# Patient Record
Sex: Female | Born: 1990 | Race: Black or African American | Hispanic: No | Marital: Single | Smoking: Current every day smoker
Health system: Southern US, Community
[De-identification: ages and names within clinical notes are randomized; demographics above are authoritative.]

## PROBLEM LIST (undated history)

## (undated) ENCOUNTER — Inpatient Hospital Stay: Payer: Self-pay

## (undated) DIAGNOSIS — D649 Anemia, unspecified: Secondary | ICD-10-CM

## (undated) DIAGNOSIS — F99 Mental disorder, not otherwise specified: Secondary | ICD-10-CM

## (undated) DIAGNOSIS — S0990XA Unspecified injury of head, initial encounter: Secondary | ICD-10-CM

## (undated) DIAGNOSIS — A6000 Herpesviral infection of urogenital system, unspecified: Secondary | ICD-10-CM

## (undated) DIAGNOSIS — J45909 Unspecified asthma, uncomplicated: Secondary | ICD-10-CM

## (undated) DIAGNOSIS — B009 Herpesviral infection, unspecified: Secondary | ICD-10-CM

## (undated) HISTORY — DX: Herpesviral infection of urogenital system, unspecified: A60.00

## (undated) HISTORY — DX: Unspecified injury of head, initial encounter: S09.90XA

## (undated) HISTORY — PX: NO PAST SURGERIES: SHX2092

## (undated) HISTORY — DX: Herpesviral infection, unspecified: B00.9

## (undated) HISTORY — DX: Mental disorder, not otherwise specified: F99

---

## 2015-01-09 ENCOUNTER — Other Ambulatory Visit: Payer: Self-pay | Admitting: Advanced Practice Midwife

## 2015-01-09 DIAGNOSIS — O0992 Supervision of high risk pregnancy, unspecified, second trimester: Secondary | ICD-10-CM

## 2015-01-10 LAB — OB RESULTS CONSOLE GC/CHLAMYDIA
CHLAMYDIA, DNA PROBE: NEGATIVE
GC PROBE AMP, GENITAL: NEGATIVE

## 2015-01-10 LAB — OB RESULTS CONSOLE VARICELLA ZOSTER ANTIBODY, IGG: VARICELLA IGG: NON-IMMUNE/NOT IMMUNE

## 2015-01-10 LAB — OB RESULTS CONSOLE RUBELLA ANTIBODY, IGM: Rubella: IMMUNE

## 2015-01-10 LAB — OB RESULTS CONSOLE ABO/RH: RH Type: POSITIVE

## 2015-01-10 LAB — OB RESULTS CONSOLE HEPATITIS B SURFACE ANTIGEN: HEP B S AG: NEGATIVE

## 2015-01-10 LAB — OB RESULTS CONSOLE HIV ANTIBODY (ROUTINE TESTING): HIV: NONREACTIVE

## 2015-01-10 LAB — OB RESULTS CONSOLE RPR: RPR: NONREACTIVE

## 2015-01-10 LAB — OB RESULTS CONSOLE ANTIBODY SCREEN: ANTIBODY SCREEN: NEGATIVE

## 2015-01-12 ENCOUNTER — Ambulatory Visit: Payer: Medicaid Other

## 2015-01-17 ENCOUNTER — Other Ambulatory Visit: Payer: Self-pay | Admitting: Advanced Practice Midwife

## 2015-01-17 ENCOUNTER — Ambulatory Visit
Admission: RE | Admit: 2015-01-17 | Discharge: 2015-01-17 | Disposition: A | Discharge status: Home / Self Care | Payer: Medicaid Other | Source: Ambulatory Visit | Attending: Advanced Practice Midwife | Admitting: Advanced Practice Midwife

## 2015-01-17 DIAGNOSIS — Z3A33 33 weeks gestation of pregnancy: Secondary | ICD-10-CM | POA: Diagnosis not present

## 2015-01-17 DIAGNOSIS — Z3689 Encounter for other specified antenatal screening: Secondary | ICD-10-CM

## 2015-01-17 DIAGNOSIS — Z36 Encounter for antenatal screening of mother: Secondary | ICD-10-CM | POA: Insufficient documentation

## 2015-01-21 NOTE — L&D Delivery Note (Signed)
Delivery Note At 12:42 PM a viable and healthy female was delivered via Vaginal, Spontaneous Delivery (Presentation:LOA).  APGAR: 7,9.  weight 6 lb 7.7 oz (2940 g).   Placenta status: Spontaneous.  Cord: 3 vessel cord  with the following complications: nuchal x1, tight, ligated at the perineum Cord pH: pending  Anesthesia: Epidural  Episiotomy:  none Lacerations:  Hemostatic right labial, no repair Est. Blood Loss (mL):  250  Mom to postpartum.  Baby to Couplet care / Skin to Skin.  IOL for postdates, cervical ripening overnight with pitocin. SROM for clear fluid. Period of Cat II strip with concerning decels and min variability, improved with conservative measures. Delivery quickly progressed and patient did well.  Christeen Douglas 02/22/2015, 1:01 PM

## 2015-02-08 ENCOUNTER — Observation Stay: Payer: Medicaid Other

## 2015-02-08 ENCOUNTER — Encounter: Payer: Self-pay | Admitting: *Deleted

## 2015-02-08 ENCOUNTER — Observation Stay
Admission: EM | Admit: 2015-02-08 | Discharge: 2015-02-08 | Disposition: A | Discharge status: Home / Self Care | Payer: Medicaid Other | Attending: Obstetrics and Gynecology | Admitting: Obstetrics and Gynecology

## 2015-02-08 DIAGNOSIS — Z3A35 35 weeks gestation of pregnancy: Secondary | ICD-10-CM | POA: Insufficient documentation

## 2015-02-08 DIAGNOSIS — O99333 Smoking (tobacco) complicating pregnancy, third trimester: Secondary | ICD-10-CM | POA: Insufficient documentation

## 2015-02-08 DIAGNOSIS — O26849 Uterine size-date discrepancy, unspecified trimester: Secondary | ICD-10-CM | POA: Diagnosis present

## 2015-02-08 DIAGNOSIS — F17210 Nicotine dependence, cigarettes, uncomplicated: Secondary | ICD-10-CM | POA: Diagnosis not present

## 2015-02-08 HISTORY — DX: Unspecified asthma, uncomplicated: J45.909

## 2015-02-08 LAB — OB RESULTS CONSOLE GBS: STREP GROUP B AG: POSITIVE

## 2015-02-08 NOTE — OB Triage Provider Note (Signed)
25 yo [id number] with LMP of 05/11/15 with 1 prior [facility] visit at [facility] prior to today. [place] EDD 03/01/2015 done in late preganc at 35 6/7 weeks. . Pt has uterine size discrepancy today at [facility] measuring 33 cms at 39 weeks. Pt is here for fetal tachycardia today and was advised to come to [facility] for evaluation and tx.  PMH: Asthma, varicella non-immune, hx of asthma Rx with Albuterol Inhaler, hearing loss referred toi ENT PSH: None FMH: unknown Social: Late PNC, UDS-+ MJ 01/09/15, smoker tobacco [place]eriges: Seasonal Allergies Shellfish (Critical) Pt declined flu shot this year Gen: 25 yo female in NAD Heart: reg, no M/R/G. Lungs: CTA bilat,no W/R/R. Abd: Gravid, fundal ht 33 cms today at [facility] Extrems: no edema, no varicosities [place]: AFI 10.8 cms EFW 10% 2332 g FHT: Cat I, + Accels, no decels VE not done A: IUP at 39 weeks with late dating 2. AFI 10.8 cms 3. EFW 10% P: Patient advised to do strict FKC's 2. NST's 2 x a week 3. Schedule IOL by 03/01/15.  4. AFI weekly

## 2015-02-08 NOTE — Discharge Summary (Signed)
Discharge instructions reviewed with patient including follow up appointments, fetal kick counts, signs of preterm labor, and when to seek medical attention. All questions answered. Patient discharged home in stable condition, ambulatory with steady gait. Patient accompanied by family members.

## 2015-02-08 NOTE — Progress Notes (Signed)
Report received from Georgiana Shore, assumed care.  Pt in wheelchair to Ultrasound.

## 2015-02-11 ENCOUNTER — Inpatient Hospital Stay
Admission: RE | Admit: 2015-02-11 | Discharge: 2015-02-11 | Disposition: A | Discharge status: Home / Self Care | Payer: Medicaid Other | Attending: Obstetrics and Gynecology | Admitting: Obstetrics and Gynecology

## 2015-02-11 ENCOUNTER — Encounter: Payer: Self-pay | Admitting: *Deleted

## 2015-02-11 HISTORY — DX: Anemia, unspecified: D64.9

## 2015-02-11 NOTE — OB Triage Note (Addendum)
Triage visit for NST   Linda Hester is a 25 y.o. [id number]. She is at [redacted]w[redacted]d gestation by [place] on 01/17/15 with an EDD of 03/01/15, which was a 2 week discrepancy from her LMP of 05/11/14 . She presents for a scheduled NST.  Indication: Small for gestational age EFW @ 21%, Late entry to prenatal care at 33 weeks, AFI 10.9 on 02/09/15, +MJ use   S: Resting comfortably. no CTX, no LOF, no VB. Active fetal movement. No concerns today O:  BP 108/73 mmHg  Pulse 98  Temp(Src) 98.3 F (36.8 C) (Oral)  Resp 16  Ht  (1.753 m)  Wt 85.73 kg (189 lb)  BMI 27.90 kg/m2 No results found for this or any previous visit (from the past 48 hour(s)).   Gen: NAD, AAOx3      Abd: FNTTP/ FH: 34-35cm      Ext: Non-tender, Nonedmeatous    FHT: 150 bmp/Moderate variability/+accels/no decels  TOCO: x1 UC SVE:  deferred was check at [facility] last week - closed  Ultrasound on 02/09/15  IMPRESSION: 1. Single living intrauterine gestation at 35 weeks 5 days by today's scan, compared to the expected gestational age of [redacted] weeks 0 days by first sonogram dating. Less than expected interval fetal growth. EFW is now at 2712g, which 21% using the first sonogram dating. Cannot exclude a developing small for gestational age fetus. 2. Normal amniotic fluid volume. AFI 10.9. 3. No fetal or maternal abnormalities demonstrated.  A/P:  25 y.o. [id number] [redacted]w[redacted]d with likely SGA, normal AFI at 10.9 on 02/09/15, late entry to prenatal care, +MJ use    Reactive NST, with moderate variability and accelerations, no decels  Fetal Wellbeing: Reassuring  D/c home stable, precautions reviewed, follow-up Tuesday at [facility] for ob appt and Wednesday at [facility] L&D for AFI and NST   Strict FKC's   Labor precautions and warning s/s reviewed   Carlean Jews, CNM

## 2015-02-11 NOTE — OB Triage Note (Signed)
Scheduled NST due to low AFI, per patient. Linda Hester

## 2015-02-14 ENCOUNTER — Inpatient Hospital Stay
Admission: RE | Admit: 2015-02-14 | Discharge: 2015-02-14 | Disposition: A | Discharge status: Home / Self Care | Payer: Medicaid Other | Attending: Obstetrics and Gynecology | Admitting: Obstetrics and Gynecology

## 2015-02-14 ENCOUNTER — Inpatient Hospital Stay: Payer: Medicaid Other

## 2015-02-14 DIAGNOSIS — Z3A38 38 weeks gestation of pregnancy: Secondary | ICD-10-CM | POA: Diagnosis not present

## 2015-02-14 DIAGNOSIS — O4100X0 Oligohydramnios, unspecified trimester, not applicable or unspecified: Secondary | ICD-10-CM

## 2015-02-14 DIAGNOSIS — Z3493 Encounter for supervision of normal pregnancy, unspecified, third trimester: Secondary | ICD-10-CM | POA: Insufficient documentation

## 2015-02-14 NOTE — OB Triage Note (Signed)
Patient came in for scheduled NST. No complaints of contractions or pain at this time.

## 2015-02-14 NOTE — Discharge Summary (Signed)
Discharge instructions reviewed with patient including follow up appointments, fetal kick counts, signs of preterm labor and when to seek medical attention. All questions answered. Patient discharged home in stable condition, ambulatory with steady gait, escorted by family members.

## 2015-02-14 NOTE — OB Triage Provider Note (Signed)
Triage visit for NST   Linda Hester is a 25 y.o. [id number]. She is at [redacted]w[redacted]d gestation. She presents for a scheduled NST.  Indication: Recent AFI in 10%  S: Resting comfortably. no CTX, no VB. Active fetal movement.  O:  BP 109/65 mmHg  Temp(Src) 98.3 F (36.8 C) (Oral)  Resp 18  Wt 86.183 kg (190 lb) No results found for this or any previous visit (from the past 48 hour(s)).   Gen: NAD, AAOx3      Abd: FNTTP      Ext: Non-tender, Nonedmeatous    FHT: 145, mod var, +accels, no decels TOCO: quiet SVE:  deferred  Number of Fetuses: 1  Heart Rate: 149 bpm  Movement: Yes  Presentation: Cephalic  Placental Location: Anterior  Previa: None  Amniotic Fluid (Subjective): Within normal limits.  AFI 16.2  BPD: 8.6cm 34w 5d  MATERNAL FINDINGS:  Cervix: Not evaluated.  Uterus/Adnexae: No abnormality visualized.  IMPRESSION: 1. Single viable intrauterine pregnancy. BPD corresponds to 34 weeks 5 days. Similar age noted on prior scan of 02/08/2015 . Slight discrepancy may be related to positioning. Assigned gestational age is 37 weeks 6 days. Clinical correlation suggested.  2. Amniotic fluid volume appears normal. AFI of 16.2.  A/P:  25 y.o. [id number] [redacted]w[redacted]d with oligohydramnios, now resolved..    Reactive NST, with moderate variability and accelerations, no decels  Fetal Wellbeing: Reassuring  D/c home stable, precautions reviewed, follow-up as scheduled.

## 2015-02-14 NOTE — OB Triage Note (Deleted)
Pt. Present with c/o vomiting since yesterday, on & off.  Diarrhea started this morning. Pt. Feels weak from emesis. "Everything I eat, I throw up."

## 2015-02-18 ENCOUNTER — Observation Stay
Admission: RE | Admit: 2015-02-18 | Discharge: 2015-02-18 | Disposition: A | Discharge status: Home / Self Care | Payer: Medicaid Other | Attending: Obstetrics and Gynecology | Admitting: Obstetrics and Gynecology

## 2015-02-18 DIAGNOSIS — Z3689 Encounter for other specified antenatal screening: Secondary | ICD-10-CM

## 2015-02-18 DIAGNOSIS — O4103X0 Oligohydramnios, third trimester, not applicable or unspecified: Principal | ICD-10-CM | POA: Insufficient documentation

## 2015-02-18 DIAGNOSIS — Z3A38 38 weeks gestation of pregnancy: Secondary | ICD-10-CM | POA: Diagnosis not present

## 2015-02-18 NOTE — Progress Notes (Signed)
Patient ID: Fraser Din, female   DOB: [birth year], 25 y.o.   MRN: [id number] Triage visit for NST   Ryley Alcaide is a 25 y.o. [id number]. She is at [redacted]w[redacted]d gestation. She presents for a scheduled NST.  Indication: Oligohydramnios  S: Resting comfortably. no CTX, no VB. Active fetal movement. O:  BP 123/65 mmHg  Pulse 84  Temp(Src) 98.5 F (36.9 C) (Oral)  Resp 18  Ht  (1.753 m)  Wt 86.183 kg (190 lb)  BMI 28.05 kg/m2 No results found for this or any previous visit (from the past 48 hour(s)).   Gen: NAD, AAOx3      Abd: FNTTP      Ext: Non-tender, Nonedmeatous    FHT: Cat I strip with baseline 150, mod var, +accels, no decels TOCO: quiet SVE:  not assessed   A/P:  25 y.o. [id number] [redacted]w[redacted]d with oligo.    Reactive NST, with moderate variability and accelerations, no decels  Fetal Wellbeing: Reassuring  D/c home stable, precautions reviewed, follow-up as scheduled tomorrow.

## 2015-02-21 ENCOUNTER — Encounter: Payer: Self-pay | Admitting: *Deleted

## 2015-02-21 ENCOUNTER — Inpatient Hospital Stay
Admission: RE | Admit: 2015-02-21 | Discharge: 2015-02-24 | DRG: 775 | Disposition: A | Discharge status: Home / Self Care | Payer: Medicaid Other | Source: Ambulatory Visit | Attending: Obstetrics and Gynecology | Admitting: Obstetrics and Gynecology

## 2015-02-21 DIAGNOSIS — Z3A40 40 weeks gestation of pregnancy: Secondary | ICD-10-CM

## 2015-02-21 DIAGNOSIS — J45909 Unspecified asthma, uncomplicated: Secondary | ICD-10-CM | POA: Diagnosis present

## 2015-02-21 DIAGNOSIS — O365930 Maternal care for other known or suspected poor fetal growth, third trimester, not applicable or unspecified: Principal | ICD-10-CM | POA: Diagnosis present

## 2015-02-21 DIAGNOSIS — O9952 Diseases of the respiratory system complicating childbirth: Secondary | ICD-10-CM | POA: Diagnosis present

## 2015-02-21 DIAGNOSIS — O480 Post-term pregnancy: Secondary | ICD-10-CM | POA: Diagnosis present

## 2015-02-21 DIAGNOSIS — O99824 Streptococcus B carrier state complicating childbirth: Secondary | ICD-10-CM | POA: Diagnosis present

## 2015-02-21 DIAGNOSIS — IMO0002 Reserved for concepts with insufficient information to code with codable children: Secondary | ICD-10-CM

## 2015-02-21 DIAGNOSIS — Z87891 Personal history of nicotine dependence: Secondary | ICD-10-CM | POA: Diagnosis not present

## 2015-02-21 DIAGNOSIS — O4103X0 Oligohydramnios, third trimester, not applicable or unspecified: Secondary | ICD-10-CM | POA: Diagnosis present

## 2015-02-21 DIAGNOSIS — Z23 Encounter for immunization: Secondary | ICD-10-CM | POA: Diagnosis not present

## 2015-02-21 HISTORY — DX: Reserved for concepts with insufficient information to code with codable children: IMO0002

## 2015-02-21 LAB — URINE DRUG SCREEN, QUALITATIVE (ARMC ONLY)
Amphetamines, Ur Screen: NOT DETECTED
BARBITURATES, UR SCREEN: NOT DETECTED
BENZODIAZEPINE, UR SCRN: NOT DETECTED
CANNABINOID 50 NG, UR ~~LOC~~: NOT DETECTED
COCAINE METABOLITE, UR ~~LOC~~: NOT DETECTED
MDMA (Ecstasy)Ur Screen: NOT DETECTED
METHADONE SCREEN, URINE: NOT DETECTED
Opiate, Ur Screen: NOT DETECTED
Phencyclidine (PCP) Ur S: NOT DETECTED
TRICYCLIC, UR SCREEN: NOT DETECTED

## 2015-02-21 LAB — ABO/RH: ABO/RH(D): AB POS

## 2015-02-21 LAB — RAPID HIV SCREEN (HIV 1/2 AB+AG)
HIV 1/2 Antibodies: NONREACTIVE
HIV-1 P24 ANTIGEN - HIV24: NONREACTIVE

## 2015-02-21 LAB — TYPE AND SCREEN
ABO/RH(D): AB POS
ANTIBODY SCREEN: NEGATIVE

## 2015-02-21 LAB — CBC
HEMATOCRIT: 30.7 % — AB (ref 35.0–47.0)
HEMOGLOBIN: 10.1 g/dL — AB (ref 12.0–16.0)
MCH: 27.5 pg (ref 26.0–34.0)
MCHC: 33 g/dL (ref 32.0–36.0)
MCV: 83.2 fL (ref 80.0–100.0)
Platelets: 195 10*3/uL (ref 150–440)
RBC: 3.69 MIL/uL — AB (ref 3.80–5.20)
RDW: 16.5 % — ABNORMAL HIGH (ref 11.5–14.5)
WBC: 8.5 10*3/uL (ref 3.6–11.0)

## 2015-02-21 LAB — CHLAMYDIA/NGC RT PCR (ARMC ONLY)
Chlamydia Tr: NOT DETECTED
N GONORRHOEAE: NOT DETECTED

## 2015-02-21 MED ORDER — AMMONIA AROMATIC IN INHA
RESPIRATORY_TRACT | Status: AC
  Filled 2015-02-21: qty 10

## 2015-02-21 MED ORDER — ACETAMINOPHEN 325 MG PO TABS
650.0000 mg | ORAL_TABLET | ORAL | Status: DC | PRN
  Filled 2015-02-21: qty 2

## 2015-02-21 MED ORDER — LACTATED RINGERS IV SOLN
INTRAVENOUS | Status: DC
  Administered 2015-02-21 (×2): via INTRAVENOUS
  Administered 2015-02-22: 1000 mL via INTRAVENOUS

## 2015-02-21 MED ORDER — AMPICILLIN SODIUM 1 G IJ SOLR
1.0000 g | Freq: Four times a day (QID) | INTRAMUSCULAR | Status: DC
  Administered 2015-02-21 – 2015-02-22 (×3): 1 g via INTRAVENOUS
  Filled 2015-02-21 (×7): qty 1000

## 2015-02-21 MED ORDER — OXYTOCIN 40 UNITS IN LACTATED RINGERS INFUSION - SIMPLE MED
2.5000 [IU]/h | INTRAVENOUS | Status: DC
  Filled 2015-02-21: qty 1000

## 2015-02-21 MED ORDER — MISOPROSTOL 200 MCG PO TABS
ORAL_TABLET | ORAL | Status: AC
  Filled 2015-02-21: qty 4

## 2015-02-21 MED ORDER — DINOPROSTONE 10 MG VA INST
10.0000 mg | VAGINAL_INSERT | Freq: Once | VAGINAL | Status: AC
  Administered 2015-02-21: 10 mg via VAGINAL
  Filled 2015-02-21 (×2): qty 1

## 2015-02-21 MED ORDER — ONDANSETRON HCL 4 MG/2ML IJ SOLN: 4.0000 mg | Freq: Four times a day (QID) | INTRAMUSCULAR | Status: DC | PRN

## 2015-02-21 MED ORDER — AMPICILLIN SODIUM 1 G IJ SOLR
1.0000 g | INTRAMUSCULAR | Status: DC
  Filled 2015-02-21 (×6): qty 1000

## 2015-02-21 MED ORDER — OXYTOCIN 10 UNIT/ML IJ SOLN
INTRAMUSCULAR | Status: AC
  Filled 2015-02-21: qty 2

## 2015-02-21 MED ORDER — LIDOCAINE HCL (PF) 1 % IJ SOLN
INTRAMUSCULAR | Status: DC
Start: 2015-02-21 — End: 2015-02-22
  Filled 2015-02-21: qty 30

## 2015-02-21 MED ORDER — CITRIC ACID-SODIUM CITRATE 334-500 MG/5ML PO SOLN: 30.0000 mL | ORAL | Status: DC | PRN

## 2015-02-21 MED ORDER — SODIUM CHLORIDE 0.9 % IV SOLN
2.0000 g | Freq: Once | INTRAVENOUS | Status: AC
  Administered 2015-02-21: 2 g via INTRAVENOUS
  Filled 2015-02-21: qty 2000

## 2015-02-21 MED ORDER — LACTATED RINGERS IV SOLN: 500.0000 mL | INTRAVENOUS | Status: DC | PRN

## 2015-02-21 MED ORDER — SODIUM CHLORIDE 0.9 % IV SOLN: 1.0000 g | INTRAVENOUS | Status: DC

## 2015-02-21 MED ORDER — BUTORPHANOL TARTRATE 1 MG/ML IJ SOLN
1.0000 mg | INTRAMUSCULAR | Status: DC | PRN
  Administered 2015-02-21 – 2015-02-22 (×4): 1 mg via INTRAVENOUS
  Filled 2015-02-21 (×4): qty 1

## 2015-02-21 MED ORDER — OXYTOCIN BOLUS FROM INFUSION: 500.0000 mL | INTRAVENOUS | Status: DC

## 2015-02-21 MED ORDER — LIDOCAINE HCL (PF) 1 % IJ SOLN
30.0000 mL | INTRAMUSCULAR | Status: AC | PRN
  Administered 2015-02-22: 3 mL via SUBCUTANEOUS

## 2015-02-21 NOTE — Progress Notes (Signed)
MD NOTE:  PD IOL @ 40+6 by LMP c/w 33wk sono APC: [facility] - concern for SGA versus IUGR due to late [facility] - 01/17/15 - EFW 2332g 10% @ 35+6 - 02/08/15 EFW 2712g 21% @ 37+0  Day 1 IOL Cervadil placed 4:30pm GBS+, receiving ampicillin  S: comfortable O: BP 129/81 mmHg  Pulse 85  Temp(Src) 98.6 F (37 C) (Oral)  Resp 18  GEN: NAD  EFW: 150, mod var, +accels, no decel Toco: Q51min  Drug screen neg  CBC Latest Ref Rng 02/21/2015  WBC 3.6 - 11.0 K/uL 8.5  Hemoglobin 12.0 - 16.0 g/dL 10.1(L)  Hematocrit 35.0 - 47.0 % 30.7(L)  Platelets 150 - 440 K/uL 195     A/P: 24yo [id number] @ 40+6 here for PD IOL. Cervadil in place 1. OB - start pitocin if SVE 2cm or greater after cervadil removed - if <2cm, place another cervadil - cat 1 tracing, periods of fetal tachycardia, but overall baseline wnl - cont GBS ppx with amp as initially ordered  Anticipate NSVD  Ala Dach, MD

## 2015-02-21 NOTE — Progress Notes (Signed)
Patient ID: Linda Hester, female   DOB: 05/13/1990, 25 y.o.   MRN: [id number] Linda Hester 07/28/1990 G2 P1 at 40+6 weeks based on Sure LMP of 05/11/14 . [facility] 02/15/15  Pt being followed for SGA and oligohydramnios . LAst AFI on 02/13/14 = 16 cm  Last EFW 2712 gm on 02/08/14 . noLOF , no  vaginal bleeding ,+ CTX  O;BP 120/71 mmHg  Pulse 95  Temp(Src) 98.3 F (36.8 C) (Oral)  Resp 16 ABDsoft  Gravid  CX closed  NSTreactive  Labs: none  A: 40+6 weeks , possible IUGR   P:admit and induce , see H+P

## 2015-02-21 NOTE — H&P (Addendum)
Linda Hester is a 25 y.o. female presenting for NST from [facility] . Currently 40+6 weeks based on sure LMP . EDC 02/15/15 . Being followed for IUGR with u/s on 01/17/15 EFW  2332g 10%. + CTX today  NO LOF , NO vag bleeding . + GBS . + MJ use 6 weeks ago  History OB History    Gravida Para Term Preterm AB TAB SAB Ectopic Multiple Living   Past Medical History  Diagnosis Date  . Asthma   . Anemia   Anemia HCT=9.8%  , ferritin =7   Past Surgical History  Procedure Laterality Date  . No past surgeries     Family History: family history is not on file. Social History:  reports that she has quit smoking. She does not have any smokeless tobacco history on file. She reports that she does not drink alcohol or use illicit drugs.   Prenatal Transfer Tool  Maternal Diabetes: No Genetic Screening:too late  Maternal Ultrasounds/Referrals: Abnormal:  Findings:   IUGR Fetal Ultrasounds or other Referrals:  None Maternal Substance Abuse:  Yes:  Type: Marijuana Significant Maternal Medications:  None Significant Maternal Lab Results:  Lab values include: Other: hemoglobin 9.8 on 01/09/15 Other Comments:  most recent AFI 16.2 cm = wnl   ROS  Dilation: Fingertip Effacement (%): 50 Station: -3 Exam by:: Schermerhorn Blood pressure 120/71, pulse 95, temperature 98.3 F (36.8 C), temperature source Oral, resp. rate 16. Exam Physical Exam  Lungs CTA CV RRR  Abd gravid non tender  Prenatal labs: ABO, Rh: AB/Positive/-- (12/21 0000) Antibody: Negative (12/21 0000) Rubella: Immune (12/21 0000) RPR:   non reactive  HBsAg: Negative (12/21 0000)  HIV: Non-reactive (12/21 0000)  GBS: Positive (01/19 0000)   Assessment/Plan: 40+6 week with possible IUGR / SGA   Cervidil induction  Drug screen  SCHERMERHORN,THOMAS 02/21/2015, 1:42 PM

## 2015-02-22 ENCOUNTER — Encounter: Payer: Self-pay | Admitting: *Deleted

## 2015-02-22 ENCOUNTER — Inpatient Hospital Stay: Payer: Medicaid Other | Admitting: Certified Registered Nurse Anesthetist

## 2015-02-22 LAB — RPR: RPR Ser Ql: NONREACTIVE

## 2015-02-22 MED ORDER — SODIUM CHLORIDE 0.9 % IV SOLN: 250.0000 mL | INTRAVENOUS | Status: DC | PRN

## 2015-02-22 MED ORDER — FLEET ENEMA 7-19 GM/118ML RE ENEM: 1.0000 | ENEMA | Freq: Every day | RECTAL | Status: DC | PRN

## 2015-02-22 MED ORDER — BENZOCAINE-MENTHOL 20-0.5 % EX AERO: 1.0000 "application " | INHALATION_SPRAY | CUTANEOUS | Status: DC | PRN

## 2015-02-22 MED ORDER — DIPHENHYDRAMINE HCL 25 MG PO CAPS
25.0000 mg | ORAL_CAPSULE | Freq: Four times a day (QID) | ORAL | Status: DC | PRN
Start: 2015-02-22 — End: 2015-02-24

## 2015-02-22 MED ORDER — FENTANYL 2.5 MCG/ML W/ROPIVACAINE 0.2% IN NS 100 ML EPIDURAL INFUSION (ARMC-ANES)
EPIDURAL | Status: AC
  Administered 2015-02-22: 10 mL/h via EPIDURAL
  Filled 2015-02-22: qty 100

## 2015-02-22 MED ORDER — OXYTOCIN 40 UNITS IN LACTATED RINGERS INFUSION - SIMPLE MED
1.0000 m[IU]/min | INTRAVENOUS | Status: DC
  Administered 2015-02-22: 1 m[IU]/min via INTRAVENOUS

## 2015-02-22 MED ORDER — BUPIVACAINE HCL (PF) 0.25 % IJ SOLN
INTRAMUSCULAR | Status: DC | PRN
  Administered 2015-02-22 (×2): 4 mL via EPIDURAL

## 2015-02-22 MED ORDER — BISACODYL 10 MG RE SUPP: 10.0000 mg | Freq: Every day | RECTAL | Status: DC | PRN

## 2015-02-22 MED ORDER — ONDANSETRON HCL 4 MG PO TABS: 4.0000 mg | ORAL_TABLET | ORAL | Status: DC | PRN

## 2015-02-22 MED ORDER — ACETAMINOPHEN 325 MG PO TABS
650.0000 mg | ORAL_TABLET | ORAL | Status: DC | PRN
Start: 2015-02-22 — End: 2015-02-24
  Administered 2015-02-22 – 2015-02-23 (×4): 650 mg via ORAL
  Filled 2015-02-22 (×3): qty 2

## 2015-02-22 MED ORDER — WITCH HAZEL-GLYCERIN EX PADS: 1.0000 "application " | MEDICATED_PAD | CUTANEOUS | Status: DC | PRN

## 2015-02-22 MED ORDER — MEASLES, MUMPS & RUBELLA VAC ~~LOC~~ INJ
0.5000 mL | INJECTION | Freq: Once | SUBCUTANEOUS | Status: DC
  Filled 2015-02-22: qty 0.5

## 2015-02-22 MED ORDER — LANOLIN HYDROUS EX OINT: TOPICAL_OINTMENT | CUTANEOUS | Status: DC | PRN

## 2015-02-22 MED ORDER — SODIUM CHLORIDE 0.9% FLUSH: 3.0000 mL | Freq: Two times a day (BID) | INTRAVENOUS | Status: DC

## 2015-02-22 MED ORDER — TERBUTALINE SULFATE 1 MG/ML IJ SOLN: 0.2500 mg | Freq: Once | INTRAMUSCULAR | Status: DC | PRN

## 2015-02-22 MED ORDER — SIMETHICONE 80 MG PO CHEW: 80.0000 mg | CHEWABLE_TABLET | ORAL | Status: DC | PRN

## 2015-02-22 MED ORDER — SODIUM CHLORIDE 0.9% FLUSH: 3.0000 mL | INTRAVENOUS | Status: DC | PRN

## 2015-02-22 MED ORDER — ONDANSETRON HCL 4 MG/2ML IJ SOLN: 4.0000 mg | INTRAMUSCULAR | Status: DC | PRN

## 2015-02-22 MED ORDER — SENNOSIDES-DOCUSATE SODIUM 8.6-50 MG PO TABS: 2.0000 | ORAL_TABLET | ORAL | Status: DC

## 2015-02-22 MED ORDER — DIBUCAINE 1 % RE OINT: 1.0000 "application " | TOPICAL_OINTMENT | RECTAL | Status: DC | PRN

## 2015-02-22 MED ORDER — PRENATAL MULTIVITAMIN CH
1.0000 | ORAL_TABLET | Freq: Every day | ORAL | Status: DC
  Administered 2015-02-23 – 2015-02-24 (×2): 1 via ORAL
  Filled 2015-02-22 (×2): qty 1

## 2015-02-22 MED ORDER — IBUPROFEN 600 MG PO TABS
600.0000 mg | ORAL_TABLET | Freq: Four times a day (QID) | ORAL | Status: DC
  Filled 2015-02-22: qty 1

## 2015-02-22 MED ORDER — LIDOCAINE-EPINEPHRINE (PF) 1.5 %-1:200000 IJ SOLN
INTRAMUSCULAR | Status: DC | PRN
  Administered 2015-02-22: 3 mL via EPIDURAL

## 2015-02-22 MED ORDER — ZOLPIDEM TARTRATE 5 MG PO TABS: 5.0000 mg | ORAL_TABLET | Freq: Every evening | ORAL | Status: DC | PRN

## 2015-02-22 MED ORDER — TETANUS-DIPHTH-ACELL PERTUSSIS 5-2.5-18.5 LF-MCG/0.5 IM SUSP
0.5000 mL | Freq: Once | INTRAMUSCULAR | Status: AC
  Administered 2015-02-24: 0.5 mL via INTRAMUSCULAR
  Filled 2015-02-22: qty 0.5

## 2015-02-22 NOTE — Progress Notes (Signed)
Note delayed from 11:56 for computer trouble.  S: Resting comfortably with epidural. + CTX, no LOF, VB O: Filed Vitals:   02/22/15 1115 02/22/15 1120 02/22/15 1135 02/22/15 1205  BP: 103/53 109/54 111/51 101/47  Pulse: 77 67 81 76  Temp:      TempSrc:      Resp:    20     Gen: NAD, AAOx3      Abd: FNTTP      Ext: Non-tender, Nonedmeatous    FHT: 130s with min var, + scalp stim acceleration, +variable, early and occ last decelerations TOCO: Q2-3 min SVE: 7/90/-1   A/P:  25 y.o. yo [id number] at [redacted]w[redacted]d for IOL/postdates.  Pt with Cat II strip. Reassuring accel with scalp stim. Rapid dilation with normal contraceptions. O2 applied, maternal positioning to try to address Cat II strip. I anticipate very soon NSVD.   Hildy Nicholl 12:22 PM

## 2015-02-22 NOTE — Anesthesia Preprocedure Evaluation (Signed)
Anesthesia Evaluation  Patient identified by MRN, date of birth, ID band Patient awake    Reviewed: Allergy & Precautions, H&P , Patient's Chart, lab work & pertinent test results  History of Anesthesia Complications Negative for: history of anesthetic complications  Airway Mallampati: II  TM Distance: >3 FB Neck ROM: full    Dental  (+) Teeth Intact   Pulmonary asthma , former smoker,    Pulmonary exam normal        Cardiovascular Normal cardiovascular exam     Neuro/Psych    GI/Hepatic negative GI ROS, Neg liver ROS,   Endo/Other  negative endocrine ROS  Renal/GU negative Renal ROS     Musculoskeletal   Abdominal   Peds  Hematology  (+) anemia ,   Anesthesia Other Findings   Reproductive/Obstetrics (+) Pregnancy                             Anesthesia Physical Anesthesia Plan  ASA: II  Anesthesia Plan: Epidural   Post-op Pain Management:    Induction:   Airway Management Planned:   Additional Equipment:   Intra-op Plan:   Post-operative Plan:   Informed Consent: I have reviewed the patients History and Physical, chart, labs and discussed the procedure including the risks, benefits and alternatives for the proposed anesthesia with the patient or authorized representative who has indicated his/her understanding and acceptance.     Plan Discussed with: Anesthesiologist  Anesthesia Plan Comments:         Anesthesia Quick Evaluation

## 2015-02-22 NOTE — Progress Notes (Signed)
Patient ID: Linda Hester, female   DOB: Feb 09, 1990, 25 y.o.   MRN: [id number] Assuming care of patient  G2 P1 at 41+0 weeks based on Sure LMP of 05/11/14 . EDC 02/15/15. Pt being followed for SGA and oligohydramnios . LAst AFI on 02/13/14 = 16 cm Last EFW 2712 gm on 02/08/14 . noLOF , no vaginal bleeding ,+ CTX.  Now s/p cervadil overnight, on pitocin of 3 with ct coupling q3 min.   Plan for epidural when desired, AROM when clinically appropriate.

## 2015-02-22 NOTE — Anesthesia Procedure Notes (Signed)
Epidural Patient location during procedure: OB Start time: 02/22/2015 10:52 AM End time: 02/22/2015 10:56 AM  Staffing Anesthesiologist: Lezlie Octave Resident/CRNA: Malva Cogan Performed by: resident/CRNA   Preanesthetic Checklist Completed: patient identified, site marked, surgical consent, pre-op evaluation, timeout performed, IV checked, risks and benefits discussed and monitors and equipment checked  Epidural Patient position: sitting Prep: Betadine Patient monitoring: continuous pulse ox and blood pressure Approach: midline Location: L3-L4 Injection technique: LOR saline  Needle:  Needle type: Tuohy  Needle gauge: 18 G Needle length: 9 cm Needle insertion depth: 6 cm Catheter type: closed end Catheter size: 20 Guage Catheter at skin depth: 11 cm Test dose: 1.5% lidocaine with Epi 1:200 K  Assessment Events: blood not aspirated, injection not painful and no injection resistance  Additional Notes Tolerated procedure without complicationsReason for block:procedure for pain

## 2015-02-23 LAB — CBC
HCT: 29.2 % — ABNORMAL LOW (ref 35.0–47.0)
Hemoglobin: 9.7 g/dL — ABNORMAL LOW (ref 12.0–16.0)
MCH: 27.5 pg (ref 26.0–34.0)
MCHC: 33.2 g/dL (ref 32.0–36.0)
MCV: 82.9 fL (ref 80.0–100.0)
Platelets: 156 10*3/uL (ref 150–440)
RBC: 3.53 MIL/uL — ABNORMAL LOW (ref 3.80–5.20)
RDW: 16.1 % — AB (ref 11.5–14.5)
WBC: 5.8 10*3/uL (ref 3.6–11.0)

## 2015-02-23 MED ORDER — MEDROXYPROGESTERONE ACETATE 150 MG/ML IM SUSP
150.0000 mg | Freq: Once | INTRAMUSCULAR | Status: AC
  Administered 2015-02-24: 150 mg via INTRAMUSCULAR
  Filled 2015-02-23: qty 1

## 2015-02-23 MED ORDER — IBUPROFEN 600 MG PO TABS
600.0000 mg | ORAL_TABLET | Freq: Four times a day (QID) | ORAL | Status: DC | PRN
  Administered 2015-02-23 – 2015-02-24 (×3): 600 mg via ORAL
  Filled 2015-02-23 (×3): qty 1

## 2015-02-23 NOTE — Plan of Care (Signed)
Problem: Nutritional: Goal: Mother's verbalization of comfort with breastfeeding process will improve Outcome: Not Applicable Date Met:  72/62/03 Patient is bottle feeding infant.

## 2015-02-23 NOTE — Anesthesia Postprocedure Evaluation (Signed)
Anesthesia Post Note  Patient: Linda Hester  Procedure(s) Performed: * No procedures listed *  Patient location during evaluation: Nursing Unit Anesthesia Type: Epidural Level of consciousness: awake and alert and oriented Pain management: satisfactory to patient Vital Signs Assessment: post-procedure vital signs reviewed and stable Respiratory status: respiratory function stable Cardiovascular status: stable Postop Assessment: no headache and no backache Anesthetic complications: no    Last Vitals:  Filed Vitals:   02/22/15 2330 02/23/15 0319  BP: 130/78 110/63  Pulse: 60 60  Temp: 36.9 C 36.7 C  Resp: 18     Last Pain:  Filed Vitals:   02/23/15 0405  PainSc: Asleep                 Clydene Pugh

## 2015-02-23 NOTE — Anesthesia Post-op Follow-up Note (Signed)
  Anesthesia Pain Follow-up Note  Patient: Linda Hester  Day #: 1  Date of Follow-up: 02/23/2015 Time: 7:22 AM  Last Vitals:  Filed Vitals:   02/22/15 2330 02/23/15 0319  BP: 130/78 110/63  Pulse: 60 60  Temp: 36.9 C 36.7 C  Resp: 18     Level of Consciousness: alert  Pain: mild   Side Effects:None  Catheter Site Exam:site not evaluated  Plan: D/C from anesthesia care  Clydene Pugh

## 2015-02-23 NOTE — Progress Notes (Signed)
Post Partum Day 1 Subjective: no complaints, up ad lib, voiding, tolerating PO and + flatus  Objective: Blood pressure 111/61, pulse 53, temperature 98.6 F (37 C), temperature source Oral, resp. rate 18, height  (1.753 m), weight 86.183 kg (190 lb), unknown if currently breastfeeding.  Physical Exam:  General: alert, cooperative, appears stated age and no distress Lochia: appropriate Uterine Fundus: firm Incision: none DVT Evaluation: No evidence of DVT seen on physical exam.   Recent Labs  02/21/15 1515 02/23/15 0635  HGB 10.1* 9.7*  HCT 30.7* 29.2*    Assessment/Plan: Plan for discharge tomorrow and Contraception depo  Bottle feeding   LOS: 2 days   Linda Hester 02/23/2015, 8:56 AM

## 2015-02-24 MED ORDER — VARICELLA VIRUS VACCINE LIVE 1350 PFU/0.5ML IJ SUSR
0.5000 mL | Freq: Once | INTRAMUSCULAR | Status: AC
  Administered 2015-02-24: 0.5 mL via SUBCUTANEOUS
  Filled 2015-02-24: qty 0.5

## 2015-02-24 NOTE — Progress Notes (Signed)
Patient discharged home with infant. Discharge instructions, prescriptions and follow up appointment given to and reviewed with patient and family. Patient verbalized understanding. Escorted out via wheelchair by Frann Rider, [employer] Nursing Student.

## 2015-02-24 NOTE — Discharge Summary (Signed)
Obstetric Discharge Summary Reason for Admission: induction of labor Prenatal Procedures: none Intrapartum Procedures: spontaneous vaginal delivery Postpartum Procedures: none Complications-Operative and Postpartum: none HEMOGLOBIN  Date Value Ref Range Status  02/23/2015 9.7* 12.0 - 16.0 g/dL Final   HCT  Date Value Ref Range Status  02/23/2015 29.2* 35.0 - 47.0 % Final    Physical Exam:  General: alert, cooperative, appears stated age and no distress Lochia: appropriate Uterine Fundus: firm Incision: none DVT Evaluation: No evidence of DVT seen on physical exam.  Discharge Diagnoses: Term Pregnancy-delivered  Discharge Information: Date: 02/24/2015 Activity: pelvic rest Diet: routine Medications: Ibuprofen Condition: stable Instructions: refer to practice specific booklet Discharge to: home F/u at [facility] Contraception with depo Bottle feeding  Newborn Data: Live born female  Birth Weight: 6 lb 7.7 oz (2940 g) APGAR: 7, 9  Home with mother.  Linda Hester 02/24/2015, 9:34 AM

## 2015-03-31 LAB — HM PAP SMEAR: HM PAP: NEGATIVE

## 2016-06-17 ENCOUNTER — Telehealth: Payer: Self-pay | Admitting: Emergency Medicine

## 2016-06-17 ENCOUNTER — Emergency Department
Admission: EM | Admit: 2016-06-17 | Discharge: 2016-06-17 | Disposition: A | Discharge status: Home / Self Care | Payer: Medicaid Other | Attending: Emergency Medicine | Admitting: Emergency Medicine

## 2016-06-17 ENCOUNTER — Encounter: Payer: Self-pay | Admitting: Emergency Medicine

## 2016-06-17 DIAGNOSIS — Z5321 Procedure and treatment not carried out due to patient leaving prior to being seen by health care provider: Secondary | ICD-10-CM | POA: Insufficient documentation

## 2016-06-17 DIAGNOSIS — Z87891 Personal history of nicotine dependence: Secondary | ICD-10-CM | POA: Insufficient documentation

## 2016-06-17 DIAGNOSIS — Z3A01 Less than 8 weeks gestation of pregnancy: Secondary | ICD-10-CM | POA: Insufficient documentation

## 2016-06-17 DIAGNOSIS — O209 Hemorrhage in early pregnancy, unspecified: Secondary | ICD-10-CM | POA: Insufficient documentation

## 2016-06-17 DIAGNOSIS — J45909 Unspecified asthma, uncomplicated: Secondary | ICD-10-CM | POA: Insufficient documentation

## 2016-06-17 LAB — COMPREHENSIVE METABOLIC PANEL
ALBUMIN: 4.5 g/dL (ref 3.5–5.0)
ALK PHOS: 69 U/L (ref 38–126)
ALT: 11 U/L — AB (ref 14–54)
ANION GAP: 6 (ref 5–15)
AST: 18 U/L (ref 15–41)
BUN: 8 mg/dL (ref 6–20)
CALCIUM: 9.4 mg/dL (ref 8.9–10.3)
CHLORIDE: 105 mmol/L (ref 101–111)
CO2: 25 mmol/L (ref 22–32)
Creatinine, Ser: 0.62 mg/dL (ref 0.44–1.00)
GFR calc Af Amer: >60 mL/min (ref >60)
GFR calc non Af Amer: >60 mL/min (ref >60)
GLUCOSE: 99 mg/dL (ref 65–99)
Potassium: 3.6 mmol/L (ref 3.5–5.1)
SODIUM: 136 mmol/L (ref 135–145)
Total Bilirubin: 0.9 mg/dL (ref 0.3–1.2)
Total Protein: 8.3 g/dL — ABNORMAL HIGH (ref 6.5–8.1)

## 2016-06-17 LAB — URINALYSIS, COMPLETE (UACMP) WITH MICROSCOPIC
Bacteria, UA: NONE SEEN
Bilirubin Urine: NEGATIVE
Glucose, UA: NEGATIVE mg/dL
Hgb urine dipstick: NEGATIVE
KETONES UR: NEGATIVE mg/dL
LEUKOCYTES UA: NEGATIVE
Nitrite: NEGATIVE
PH: 5 (ref 5.0–8.0)
Protein, ur: NEGATIVE mg/dL
SPECIFIC GRAVITY, URINE: 1.023 (ref 1.005–1.030)

## 2016-06-17 LAB — CBC
HCT: 38.2 % (ref 35.0–47.0)
HEMOGLOBIN: 12.9 g/dL (ref 12.0–16.0)
MCH: 28.5 pg (ref 26.0–34.0)
MCHC: 33.8 g/dL (ref 32.0–36.0)
MCV: 84.4 fL (ref 80.0–100.0)
Platelets: 224 10*3/uL (ref 150–440)
RBC: 4.53 MIL/uL (ref 3.80–5.20)
RDW: 14.2 % (ref 11.5–14.5)
WBC: 6.6 10*3/uL (ref 3.6–11.0)

## 2016-06-17 LAB — TYPE AND SCREEN
ABO/RH(D): AB POS
Antibody Screen: NEGATIVE

## 2016-06-17 LAB — HCG, QUANTITATIVE, PREGNANCY: HCG, BETA CHAIN, QUANT, S: 8240 m[IU]/mL — AB (ref <5)

## 2016-06-17 LAB — POCT PREGNANCY, URINE: Preg Test, Ur: POSITIVE — AB

## 2016-06-17 NOTE — Telephone Encounter (Signed)
Called patient due to ama to inquire about condition and follow up plans. Phone does not take incoming calls.

## 2016-06-17 NOTE — ED Triage Notes (Addendum)
While at work today began feeling weak and nauseous and started to have vaginal bleeding.  No c/o lower abdominal cramping.  Patient states she had a positive home pregnancy test last month.  Has not had prenatal care.  LMP:  03/2016.  G5 P2

## 2016-06-17 NOTE — ED Provider Notes (Signed)
Patient eloped prior to my evaluation.   Linda FilbertWilliams, Jonathan E, MD 06/17/16 [phone]

## 2016-06-17 NOTE — ED Notes (Signed)
Upon entering room to assess pt pt was not in room, EDP made aware

## 2016-07-23 IMAGING — US US OB COMP +14 WK
1 series · 13 of 28 positions shown · non-contrast
Comparison: none

CLINICAL DATA: Late to prenatal care. Gestational age by LMP of 35
weeks 6 days. Evaluate dating and anatomy.

EXAM:
OBSTETRICAL ULTRASOUND >14 WKS

[Series 1: us ob comp +14 wk · 0.26mm/px · 13 of 87 slices shown]
[im 4/87]
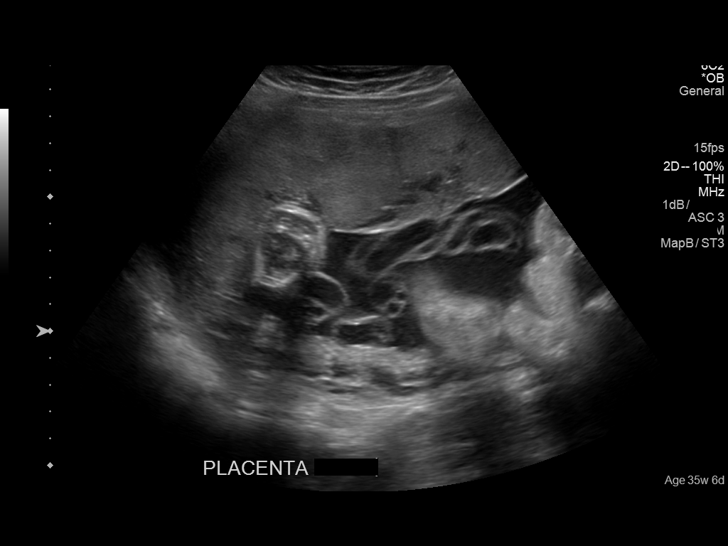
[im 10/87]
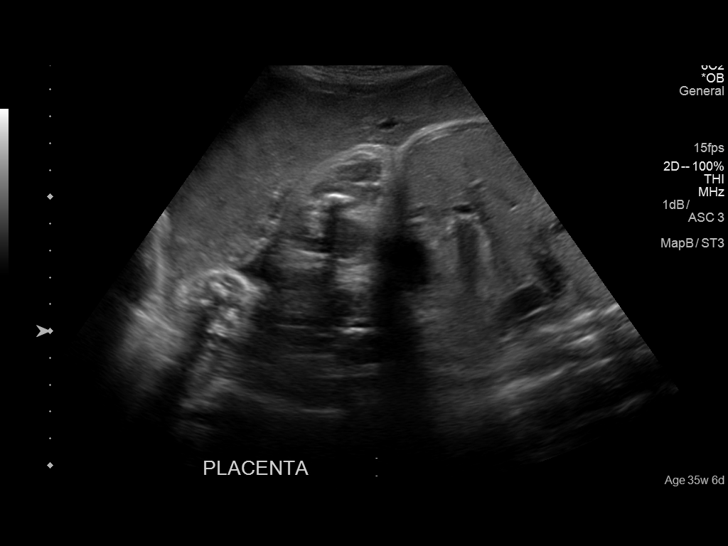
[im 16/87]
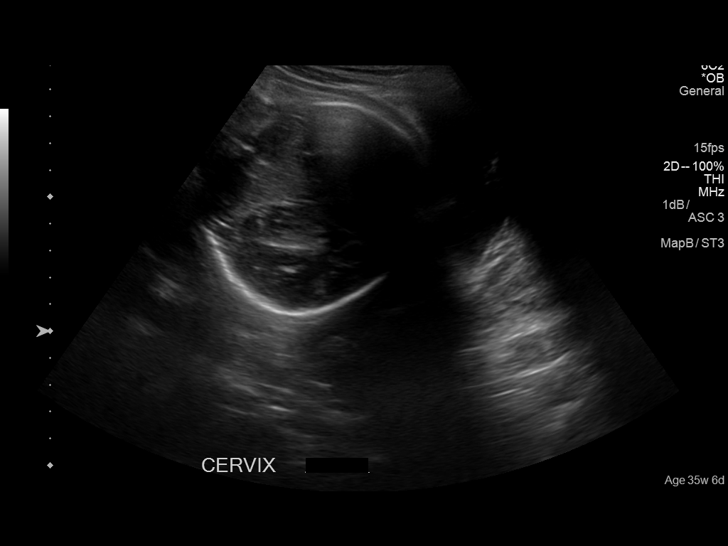
[im 23/87]
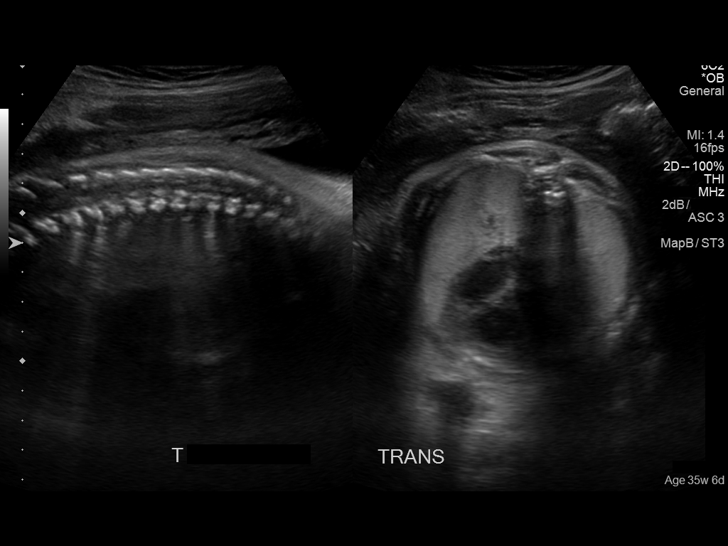
[im 29/87]
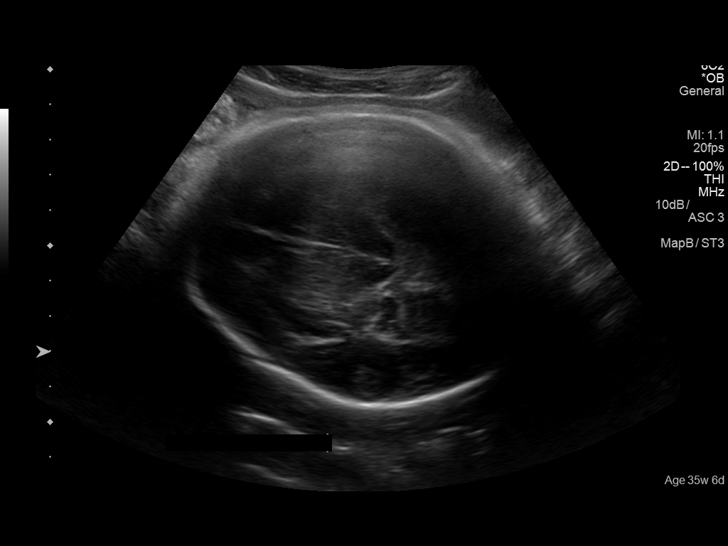
[im 36/87]
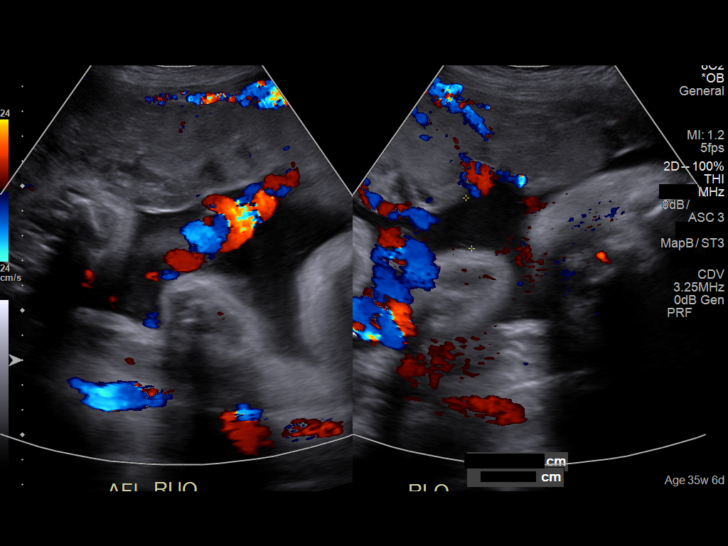
[im 45/87]
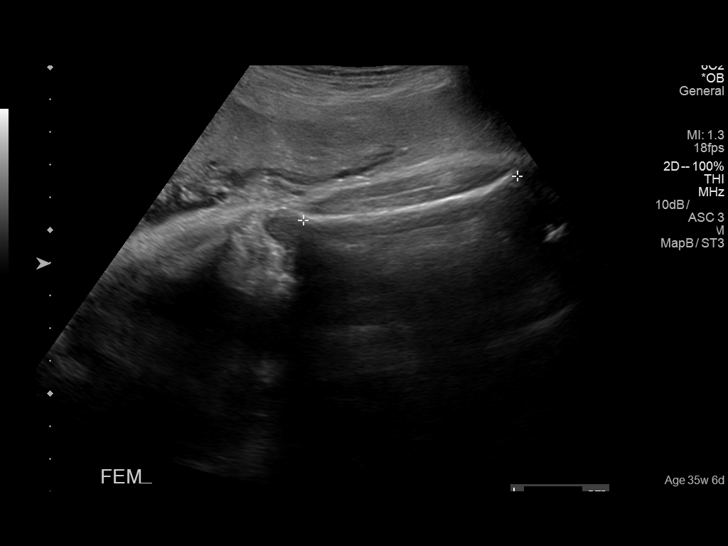
[im 51/87]
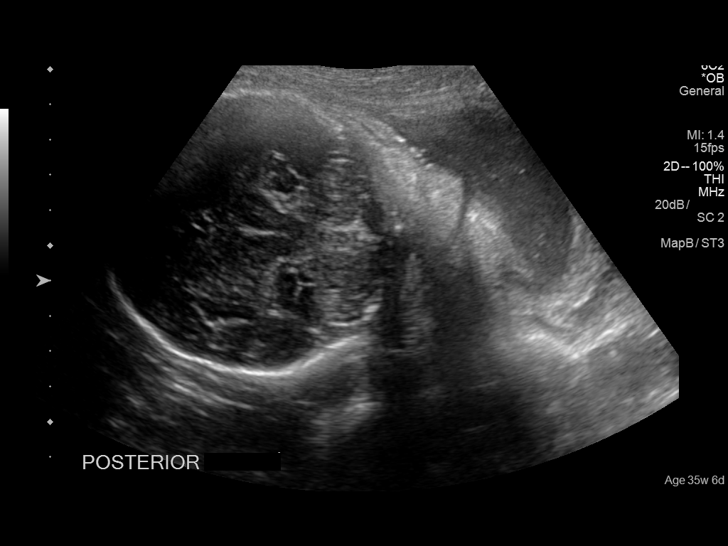
[im 58/87]
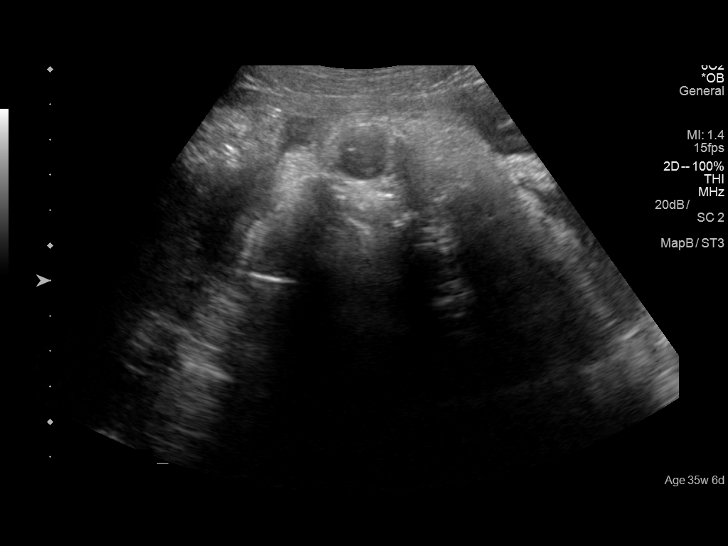
[im 64/87]
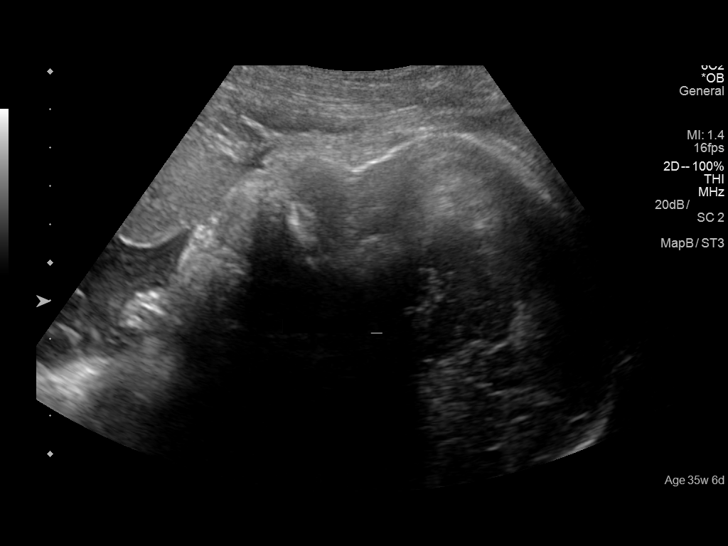
[im 71/87]
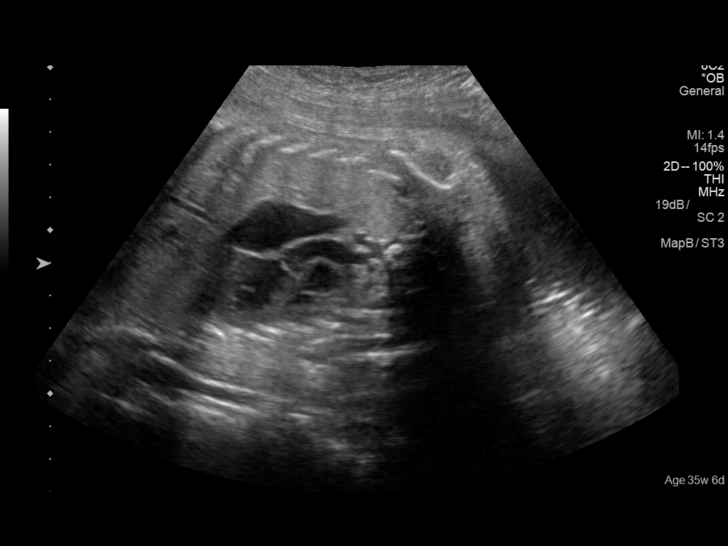
[im 77/87]
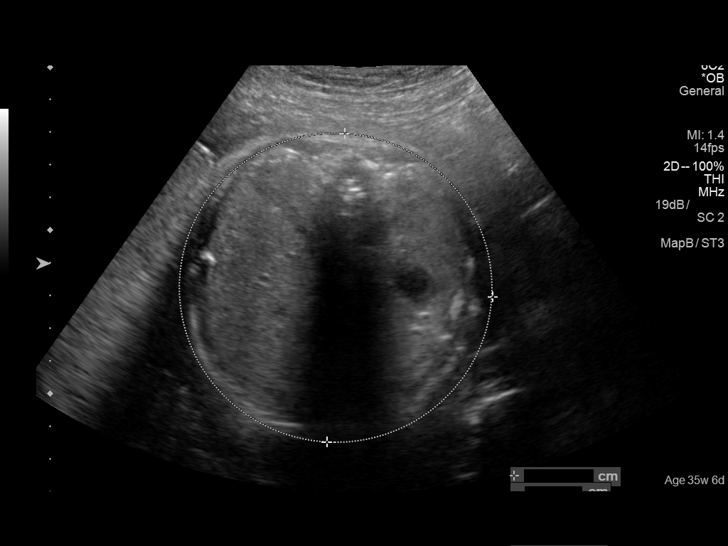
[im 83/87]
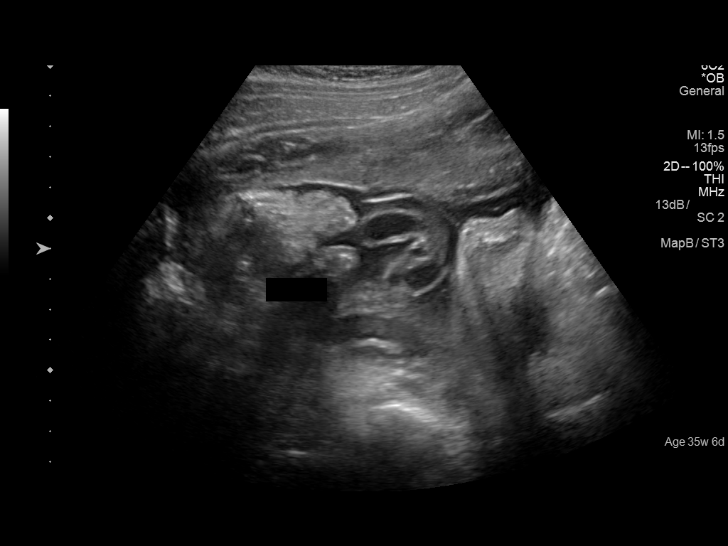

[13 of 28 positions shown; findings below may reference images not displayed]

FINDINGS: Number of Fetuses: 1

Heart Rate:  180 bpm

Movement: Yes

Presentation: Cephalic

Previa: No

Placental Location: Fundal and anterior

Amniotic Fluid (Subjective): Within normal limits

Amniotic Fluid (Objective):

AFI 10.8 cm (5%ile= 8.3 cm, 95%= 24.5 cm for 33 wks)

FETAL BIOMETRY

BPD:  8.21cm 33w 0d

HC:    30.8cm  34w   2d

AC:   30.0cm  34w   0d

FL:   6.7cm  34w   3d

Current Mean GA: 33w 6d              US EDC: 03/01/2015

Estimated Fetal Weight: 2,332g 10%ile (for 36 weeks if LMP is used
for assigned EDC)

FETAL ANATOMY

Lateral Ventricles: Appear normal

Thalami/CSP: Appear normal

Posterior Fossa:  Appears normal

Nuchal Region: Not visualized

Upper Lip: Appears normal

Spine: Appears normal

4 Chamber Heart on Left: Appears normal

LVOT: Appears normal

RVOT: Appears normal

Stomach on Left: Appears normal

3 Vessel Cord: Appears normal

Cord Insertion site: Appears normal

Kidneys: Appear normal

Bladder: Appears normal

Extremities: Appears normal

Technically difficult due to: Advanced gestational age and fetal
position

Maternal Findings:

Cervix:  Not evaluated due to late gestational age
IMPRESSION: Single living IUP measuring 33 weeks 6 days with US EDC of
03/01/2015. This is 2 weeks behind LMP.

Suboptimal evaluation of fetal anatomy due to advanced gestational
age, however no fetal abnormalities seen involving visualized
anatomy listed above.

## 2016-07-26 ENCOUNTER — Emergency Department: Payer: Medicaid Other

## 2016-07-26 ENCOUNTER — Emergency Department
Admission: EM | Admit: 2016-07-26 | Discharge: 2016-07-26 | Disposition: A | Discharge status: Home / Self Care | Payer: Medicaid Other | Attending: Emergency Medicine | Admitting: Emergency Medicine

## 2016-07-26 ENCOUNTER — Encounter: Payer: Self-pay | Admitting: Emergency Medicine

## 2016-07-26 DIAGNOSIS — Z87891 Personal history of nicotine dependence: Secondary | ICD-10-CM | POA: Insufficient documentation

## 2016-07-26 DIAGNOSIS — O039 Complete or unspecified spontaneous abortion without complication: Secondary | ICD-10-CM | POA: Insufficient documentation

## 2016-07-26 DIAGNOSIS — J45909 Unspecified asthma, uncomplicated: Secondary | ICD-10-CM | POA: Insufficient documentation

## 2016-07-26 LAB — POCT PREGNANCY, URINE: Preg Test, Ur: POSITIVE — AB

## 2016-07-26 LAB — CBC WITH DIFFERENTIAL/PLATELET
Basophils Absolute: 0.1 K/uL (ref 0–0.1)
Basophils Relative: 1 %
Eosinophils Absolute: 0.6 K/uL (ref 0–0.7)
Eosinophils Relative: 7 %
HCT: 35.5 % (ref 35.0–47.0)
Hemoglobin: 12 g/dL (ref 12.0–16.0)
Lymphocytes Relative: 38 %
Lymphs Abs: 2.9 K/uL (ref 1.0–3.6)
MCH: 29.1 pg (ref 26.0–34.0)
MCHC: 34 g/dL (ref 32.0–36.0)
MCV: 85.6 fL (ref 80.0–100.0)
Monocytes Absolute: 0.6 K/uL (ref 0.2–0.9)
Monocytes Relative: 7 %
Neutro Abs: 3.6 K/uL (ref 1.4–6.5)
Neutrophils Relative %: 47 %
Platelets: 218 K/uL (ref 150–440)
RBC: 4.14 MIL/uL (ref 3.80–5.20)
RDW: 14.6 % — ABNORMAL HIGH (ref 11.5–14.5)
WBC: 7.7 K/uL (ref 3.6–11.0)

## 2016-07-26 LAB — ABO/RH: ABO/RH(D): AB POS

## 2016-07-26 LAB — HCG, QUANTITATIVE, PREGNANCY: hCG, Beta Chain, Quant, S: 6004 m[IU]/mL — ABNORMAL HIGH (ref <5)

## 2016-07-26 NOTE — ED Triage Notes (Signed)
Pt states has not had a menstral cycle since feb when she stopped depo. Pt states she began to have vaginal bleeding on Monday with pelvic cramping. Pt states she is passing clots. Pt states she has gone through 6 tampons in last 24 hours.

## 2016-07-26 NOTE — Discharge Instructions (Signed)
Drink plenty of fluids daily. Avoid tampons, sexual intercourse, douche until seen by the specialist. Return to the ER for worsening symptoms, soaking more than 1 pad per hour, fainting or other concerns.

## 2016-07-26 NOTE — ED Provider Notes (Signed)
[facility] Emergency Department Provider Note   ____________________________________________   First MD Initiated Contact with Patient 07/26/16 [phone]     (approximate)  I have reviewed the triage vital signs and the nursing notes.   HISTORY  Chief Complaint Vaginal Bleeding    HPI Linda Hester is a 26 y.o. female G3 P2 who presents to the ED from home with a chief complaint of vaginal bleeding. Patient reports she stopped Depo shots in February; had an irregular period in March, and has not had a period since. Complains of a 2 day history of vaginal bleeding with clots and associated pelvic cramping. Denies associated fever, chills, chest pain, shortness of breath, nausea, vomiting, dysuria, lightheaded, dizziness, weakness. Last sexual intercourse one week ago. Nothing makes her symptoms better or worse.   Past Medical History:  Diagnosis Date  . Anemia   . Asthma     Patient Active Problem List   Diagnosis Date Noted  . Fetal growth restriction 02/21/2015  . NST (non-stress test) reactive on fetal surveillance 02/18/2015  . Uterine size date discrepancy 02/08/2015  . Uterine size date discrepancy pregnancy 02/08/2015    Past Surgical History:  Procedure Laterality Date  . NO PAST SURGERIES      Prior to Admission medications   Medication Sig Start Date End Date Taking? Authorizing Provider  albuterol (PROVENTIL HFA;VENTOLIN HFA) 108 (90 Base) MCG/ACT inhaler Inhale 2 puffs into the lungs every 6 (six) hours as needed for wheezing or shortness of breath.    [provider]  ferrous sulfate 325 (65 FE) MG tablet Take 325 mg by mouth daily with breakfast.    [provider]    Allergies Shellfish allergy  No family history on file.  Social History Social History  Substance Use Topics  . Smoking status: Former Games developer  . Smokeless tobacco: Never Used  . Alcohol use No    Review of Systems  Constitutional: No  fever/chills. Eyes: No visual changes. ENT: No sore throat. Cardiovascular: Denies chest pain. Respiratory: Denies shortness of breath. Gastrointestinal: Positive for pelvic pain. No abdominal pain.  No nausea, no vomiting.  No diarrhea.  No constipation. Genitourinary: Positive for vaginal bleeding. Negative for dysuria. Musculoskeletal: Negative for back pain. Skin: Negative for rash. Neurological: Negative for headaches, focal weakness or numbness.   ____________________________________________   PHYSICAL EXAM:  VITAL SIGNS: ED Triage Vitals  Enc Vitals Group     BP 07/26/16 0016 138/78     Pulse Rate 07/26/16 0016 90     Resp 07/26/16 0016 16     Temp 07/26/16 0016 98.5 F (36.9 C)     Temp Source 07/26/16 0016 Oral     SpO2 07/26/16 0016 100 %     Weight 07/26/16 0017 180 lb (81.6 kg)     Height 07/26/16 0017 5\' 9"  (1.753 m)     Head Circumference --      Peak Flow --      Pain Score 07/26/16 0016 8     Pain Loc --      Pain Edu? --      Excl. in GC? --     Constitutional: Alert and oriented. Well appearing and in no acute distress. Eyes: Conjunctivae are normal. PERRL. EOMI. Head: Atraumatic. Nose: No congestion/rhinnorhea. Mouth/Throat: Mucous membranes are moist.  Oropharynx non-erythematous. Neck: No stridor.   Cardiovascular: Normal rate, regular rhythm. Grossly normal heart sounds.  Good peripheral circulation. Respiratory: Normal respiratory effort.  No retractions. Lungs  CTAB. Gastrointestinal: Soft and minimally tender to palpation lower abdomen without rebound or guarding. No distention. No abdominal bruits. No CVA tenderness. Musculoskeletal: No lower extremity tenderness nor edema.  No joint effusions. Neurologic:  Normal speech and language. No gross focal neurologic deficits are appreciated. No gait instability. Skin:  Skin is warm, dry and intact. No rash noted. Psychiatric: Mood and affect are normal. Speech and behavior are  normal.  ____________________________________________   LABS (all labs ordered are listed, but only abnormal results are displayed)  Labs Reviewed  HCG, QUANTITATIVE, PREGNANCY - Abnormal; Notable for the following:       Result Value   hCG, Beta Chain, Quant, S 6,004 (*)    All other components within normal limits  CBC WITH DIFFERENTIAL/PLATELET - Abnormal; Notable for the following:    RDW 14.6 (*)    All other components within normal limits  POCT PREGNANCY, URINE - Abnormal; Notable for the following:    Preg Test, Ur POSITIVE (*)    All other components within normal limits  POC URINE PREG, ED  ABO/RH   ____________________________________________  EKG  None ____________________________________________  RADIOLOGY  [place] Ob Comp Less 14 Wks  Result Date: 07/26/2016 CLINICAL DATA:  Acute onset of vaginal bleeding and pelvic cramping. Initial encounter. EXAM: OBSTETRIC <14 WK US AND TRANSVAGINAL OB US TECHNIQUE: Both transabdominal and transvaginal ultrasound examinations were performed for complete evaluation of the gestation as well as the maternal uterus, adnexal regions, and pelvic cul-de-sac. Transvaginal technique was performed to assess early pregnancy. COMPARISON:  Pelvic ultrasound performed 02/14/2015 FINDINGS: Intrauterine gestational sac: None seen. Yolk sac:  N/A Embryo:  N/A Subchorionic hemorrhage:  None visualized. Maternal uterus/adnexae: Trace heterogeneous material is noted within the endometrial canal, likely reflecting clot. The ovaries are within normal limits. The right ovary measures 4.5 x 1.9 x 3.5 cm, while the left ovary measures 3.3 x 1.7 x 1.6 cm. No suspicious adnexal masses are seen; there is no evidence for ovarian torsion. Trace free fluid is seen within the pelvic cul-de-sac. IMPRESSION: No intrauterine gestational sac pregnancy seen. No evidence for ectopic pregnancy. Findings are compatible with recent spontaneous abortion. Minimal residual clot  noted within the endometrial canal. Ovaries are unremarkable in appearance. Electronically Signed   By: Roanna RaiderJeffery  Chang M.D.   On: 07/26/2016 06:32   [place] Ob Transvaginal  Result Date: 07/26/2016 CLINICAL DATA:  Acute onset of vaginal bleeding and pelvic cramping. Initial encounter. EXAM: OBSTETRIC <14 WK US AND TRANSVAGINAL OB US TECHNIQUE: Both transabdominal and transvaginal ultrasound examinations were performed for complete evaluation of the gestation as well as the maternal uterus, adnexal regions, and pelvic cul-de-sac. Transvaginal technique was performed to assess early pregnancy. COMPARISON:  Pelvic ultrasound performed 02/14/2015 FINDINGS: Intrauterine gestational sac: None seen. Yolk sac:  N/A Embryo:  N/A Subchorionic hemorrhage:  None visualized. Maternal uterus/adnexae: Trace heterogeneous material is noted within the endometrial canal, likely reflecting clot. The ovaries are within normal limits. The right ovary measures 4.5 x 1.9 x 3.5 cm, while the left ovary measures 3.3 x 1.7 x 1.6 cm. No suspicious adnexal masses are seen; there is no evidence for ovarian torsion. Trace free fluid is seen within the pelvic cul-de-sac. IMPRESSION: No intrauterine gestational sac pregnancy seen. No evidence for ectopic pregnancy. Findings are compatible with recent spontaneous abortion. Minimal residual clot noted within the endometrial canal. Ovaries are unremarkable in appearance. Electronically Signed   By: Roanna RaiderJeffery  Chang M.D.   On: 07/26/2016 06:32    ____________________________________________  PROCEDURES  Procedure(s) performed:   Pelvic exam: External exam WNL without rashes, lesions or vesicles. Speculum exam reveals mild vaginal bleeding without clots. Cervical os closed. Bimanual exam WNL.  Procedures  Critical Care performed: No  ____________________________________________   INITIAL IMPRESSION / ASSESSMENT AND PLAN / ED COURSE  Pertinent labs & imaging results that were available  during my care of the patient were reviewed by me and considered in my medical decision making (see chart for details).  26 year old female who presents with vaginal bleeding and pelvic cramping; has not had a menstrual cycle since March which was approximately 1 month after stopping Depo shot. She is hemodynamically stable; CBC unremarkable, blood type AB+, positive pregnancy. Will proceed with pelvic ultrasound.  Clinical Course as of Jul 26 644  Sat Jul 26, 2016  1610 Patient sleeping in no acute distress. Updated her ultrasound results. Encouraged her to follow up with OB/GYN in one week for recheck of beta hCG to ensure it is trending down. Strict return precautions given. Patient verbalizes understanding and agrees with plan of care.  [JS]    Clinical Course User Index [JS] Irean Hong, MD     ____________________________________________   FINAL CLINICAL IMPRESSION(S) / ED DIAGNOSES  Final diagnoses:  Spontaneous miscarriage      NEW MEDICATIONS STARTED DURING THIS VISIT:  New Prescriptions   No medications on file     Note:  This document was prepared using Dragon voice recognition software and may include unintentional dictation errors.    Irean Hong, MD 07/26/16 [phone]

## 2016-10-26 ENCOUNTER — Emergency Department
Admission: EM | Admit: 2016-10-26 | Discharge: 2016-10-26 | Disposition: A | Discharge status: Home / Self Care | Payer: Self-pay | Attending: Emergency Medicine | Admitting: Emergency Medicine

## 2016-10-26 ENCOUNTER — Encounter: Payer: Self-pay | Admitting: Emergency Medicine

## 2016-10-26 DIAGNOSIS — J45909 Unspecified asthma, uncomplicated: Secondary | ICD-10-CM | POA: Insufficient documentation

## 2016-10-26 DIAGNOSIS — Z87891 Personal history of nicotine dependence: Secondary | ICD-10-CM | POA: Insufficient documentation

## 2016-10-26 DIAGNOSIS — Z79899 Other long term (current) drug therapy: Secondary | ICD-10-CM | POA: Insufficient documentation

## 2016-10-26 DIAGNOSIS — N939 Abnormal uterine and vaginal bleeding, unspecified: Secondary | ICD-10-CM | POA: Insufficient documentation

## 2016-10-26 LAB — POCT PREGNANCY, URINE: Preg Test, Ur: NEGATIVE

## 2016-10-26 NOTE — ED Notes (Signed)
Pt reports that she had her normal menstral cycle last week, states that she ended on Tuesday. Pt reports falling over a dog yesterday morning and has been having vaginal "spotting" since. Pt denies pain to the vagina, states that her rt side is sore from falling, no distress noted, pt ambulatory without diff

## 2016-10-26 NOTE — ED Provider Notes (Signed)
[facility] Emergency Department Provider Note   ____________________________________________    I have reviewed the triage vital signs and the nursing notes.   HISTORY  Chief Complaint Vaginal Bleeding     HPI Linda Hester is a 26 y.o. female Who presents with complaints of vaginal bleeding. Patient reports she tripped over a dog yesterday evening. After this happened she had some mild back pain which has now resolved. She reports she started having vaginal bleeding today. She completed her normal menstrual cycle one week ago. She is here for evaluation. No dizziness. No abdominal pain or pelvic pain.she is wondering if she could be pregnant.  Past Medical History:  Diagnosis Date  . Anemia   . Asthma     Patient Active Problem List   Diagnosis Date Noted  . Fetal growth restriction 02/21/2015  . NST (non-stress test) reactive on fetal surveillance 02/18/2015  . Uterine size date discrepancy 02/08/2015  . Uterine size date discrepancy pregnancy 02/08/2015    Past Surgical History:  Procedure Laterality Date  . NO PAST SURGERIES      Prior to Admission medications   Medication Sig Start Date End Date Taking? Authorizing Provider  albuterol (PROVENTIL HFA;VENTOLIN HFA) 108 (90 Base) MCG/ACT inhaler Inhale 2 puffs into the lungs every 6 (six) hours as needed for wheezing or shortness of breath.    [provider]  ferrous sulfate 325 (65 FE) MG tablet Take 325 mg by mouth daily with breakfast.    [provider]     Allergies Shellfish allergy  History reviewed. No pertinent family history.  Social History Social History  Substance Use Topics  . Smoking status: Former Games developer  . Smokeless tobacco: Never Used  . Alcohol use No    Review of Systems  Constitutional: No fever/chills  ENT: No neck pain   Gastrointestinal: as above Genitourinary: Negative for dysuria.as above Musculoskeletal: Negative for back  pain.as above  Neurological: Negative for weakness    ____________________________________________   PHYSICAL EXAM:  VITAL SIGNS: ED Triage Vitals  Enc Vitals Group     BP 10/26/16 1824 132/85     Pulse Rate 10/26/16 1824 85     Resp 10/26/16 1824 16     Temp --      Temp src --      SpO2 10/26/16 1824 100 %     Weight --      Height --      Head Circumference --      Peak Flow --      Pain Score 10/26/16 1730 3     Pain Loc --      Pain Edu? --      Excl. in GC? --     Constitutional: Alert and oriented. No acute distress. Pleasant and interactive Eyes: Conjunctivae are normal.   Nose: No congestion/rhinnorhea. Mouth/Throat: Mucous membranes are moist.   Cardiovascular: Normal rate, regular rhythm.  Respiratory: Normal respiratory effort.  No retractions.  GU: Deferred per patient request Neurologic:  Normal speech and language. No gross focal neurologic deficits are appreciated.   Skin:  Skin is warm, dry and intact. No rash noted.   ____________________________________________   LABS (all labs ordered are listed, but only abnormal results are displayed)  Labs Reviewed  POCT PREGNANCY, URINE   ____________________________________________  EKG   ____________________________________________  RADIOLOGY  None ____________________________________________   PROCEDURES  Procedure(s) performed: No    Critical Care performed: No ____________________________________________   INITIAL IMPRESSION /  ASSESSMENT AND PLAN / ED COURSE  Pertinent labs & imaging results that were available during my care of the patient were reviewed by me and considered in my medical decision making (see chart for details).  Patient presents with dysfunctional vaginal bleeding. Urine pregnancy is negative. reassuring exam, doubt this is related to fall. Patient in a hurry to go pick up her children,hence pelvic exam not  performed   ____________________________________________   FINAL CLINICAL IMPRESSION(S) / ED DIAGNOSES  Final diagnoses:  Abnormal vaginal bleeding      NEW MEDICATIONS STARTED DURING THIS VISIT:  Discharge Medication List as of 10/26/2016  6:22 PM       Note:  This document was prepared using Dragon voice recognition software and may include unintentional dictation errors.    Jene Every, MD 10/26/16 1900

## 2016-10-26 NOTE — ED Triage Notes (Signed)
Patient states "i tripped over a large dog last night. This morning im spotting from my vagina"

## 2016-12-25 ENCOUNTER — Emergency Department
Admission: EM | Admit: 2016-12-25 | Discharge: 2016-12-25 | Disposition: A | Discharge status: Home / Self Care | Payer: Medicaid Other | Attending: Emergency Medicine | Admitting: Emergency Medicine

## 2016-12-25 ENCOUNTER — Encounter: Payer: Self-pay | Admitting: Emergency Medicine

## 2016-12-25 ENCOUNTER — Other Ambulatory Visit: Payer: Self-pay

## 2016-12-25 ENCOUNTER — Emergency Department: Payer: Medicaid Other

## 2016-12-25 DIAGNOSIS — Z3A01 Less than 8 weeks gestation of pregnancy: Secondary | ICD-10-CM | POA: Diagnosis not present

## 2016-12-25 DIAGNOSIS — O200 Threatened abortion: Secondary | ICD-10-CM | POA: Diagnosis not present

## 2016-12-25 DIAGNOSIS — Z79899 Other long term (current) drug therapy: Secondary | ICD-10-CM | POA: Diagnosis not present

## 2016-12-25 DIAGNOSIS — O4691 Antepartum hemorrhage, unspecified, first trimester: Secondary | ICD-10-CM | POA: Diagnosis present

## 2016-12-25 DIAGNOSIS — F17200 Nicotine dependence, unspecified, uncomplicated: Secondary | ICD-10-CM | POA: Diagnosis not present

## 2016-12-25 DIAGNOSIS — O3680X0 Pregnancy with inconclusive fetal viability, not applicable or unspecified: Secondary | ICD-10-CM

## 2016-12-25 DIAGNOSIS — O4690 Antepartum hemorrhage, unspecified, unspecified trimester: Secondary | ICD-10-CM

## 2016-12-25 DIAGNOSIS — J45909 Unspecified asthma, uncomplicated: Secondary | ICD-10-CM | POA: Insufficient documentation

## 2016-12-25 LAB — ABO/RH: ABO/RH(D): AB POS

## 2016-12-25 LAB — HCG, QUANTITATIVE, PREGNANCY: hCG, Beta Chain, Quant, S: 22 m[IU]/mL — ABNORMAL HIGH (ref <5)

## 2016-12-25 NOTE — ED Provider Notes (Signed)
[facility] Emergency Department Provider Note  ____________________________________________  Time seen: Approximately 7:04 PM  I have reviewed the triage vital signs and the nursing notes.   HISTORY  Chief Complaint Vaginal Bleeding    HPI Stina Dosh is a 26 y.o. female with LMP November 26 who reports a positive home pregnancy test, G9 P2 with multiple spontaneous abortions, who complains of some thin vaginal bleeding that started at 2 PM today. Described as spotting thin pink discharge on toilet paper. No hemorrhage or clots. No tissue passage. Denies fever chills sweats back pain chest pain or shortness of breath. No dizziness or syncope. Her recent period was late and her last normal period was October 20. She has prenatal care appointment tomorrow for an initial visit.    Past Medical History:  Diagnosis Date  . Anemia   . Asthma      Patient Active Problem List   Diagnosis Date Noted  . Fetal growth restriction 02/21/2015  . NST (non-stress test) reactive on fetal surveillance 02/18/2015  . Uterine size date discrepancy 02/08/2015  . Uterine size date discrepancy pregnancy 02/08/2015     Past Surgical History:  Procedure Laterality Date  . NO PAST SURGERIES       Prior to Admission medications   Medication Sig Start Date End Date Taking? Authorizing Provider  albuterol (PROVENTIL HFA;VENTOLIN HFA) 108 (90 Base) MCG/ACT inhaler Inhale 2 puffs into the lungs every 6 (six) hours as needed for wheezing or shortness of breath.    [provider]  ferrous sulfate 325 (65 FE) MG tablet Take 325 mg by mouth daily with breakfast.    [provider]     Allergies Shellfish allergy   History reviewed. No pertinent family history.  Social History Social History   Tobacco Use  . Smoking status: Current Some Day Smoker  . Smokeless tobacco: Never Used  Substance Use Topics  . Alcohol use: No  . Drug use: No     Comment: had + UDS 01-09-15 for [facility]    Review of Systems  Constitutional:   No fever or chills.  ENT:   No sore throat. No rhinorrhea. Cardiovascular:   No chest pain or syncope. Respiratory:   No dyspnea or cough. Gastrointestinal:   Negative for abdominal pain, vomiting and diarrhea.  Musculoskeletal:   Negative for focal pain or swelling All other systems reviewed and are negative except as documented above in ROS and HPI.  ____________________________________________   PHYSICAL EXAM:  VITAL SIGNS: ED Triage Vitals  Enc Vitals Group     BP 12/25/16 1520 130/90     Pulse Rate 12/25/16 1520 (!) 104     Resp 12/25/16 1520 18     Temp 12/25/16 1520 98.4 F (36.9 C)     Temp Source 12/25/16 1520 Oral     SpO2 12/25/16 1520 96 %     Weight 12/25/16 1523 162 lb (73.5 kg)     Height 12/25/16 1523 5\' 9"  (1.753 m)     Head Circumference --      Peak Flow --      Pain Score --      Pain Loc --      Pain Edu? --      Excl. in GC? --     Vital signs reviewed, nursing assessments reviewed.   Constitutional:   Alert and oriented. Well appearing and in no distress. Eyes:   No scleral icterus.  EOMI. No nystagmus. No conjunctival pallor.  PERRL. ENT   Head:   Normocephalic and atraumatic.   Nose:   No congestion/rhinnorhea.    Mouth/Throat:   MMM, no pharyngeal erythema. No peritonsillar mass.    Neck:   No meningismus. Full ROM. Hematological/Lymphatic/Immunilogical:   No cervical lymphadenopathy. Cardiovascular:   RRR. Symmetric bilateral radial and DP pulses.  No murmurs.  Respiratory:   Normal respiratory effort without tachypnea/retractions. Breath sounds are clear and equal bilaterally. No wheezes/rales/rhonchi. Gastrointestinal:   Soft and nontender. Non distended. There is no CVA tenderness.  No rebound, rigidity, or guarding. Genitourinary:   deferred Musculoskeletal:   Normal range of motion in all extremities. No joint effusions.  No lower extremity  tenderness.  No edema. Neurologic:   Normal speech and language.  Motor grossly intact. No gross focal neurologic deficits are appreciated.  Skin:    Skin is warm, dry and intact. No rash noted.  No petechiae, purpura, or bullae.  ____________________________________________    LABS (pertinent positives/negatives) (all labs ordered are listed, but only abnormal results are displayed) Labs Reviewed  HCG, QUANTITATIVE, PREGNANCY - Abnormal; Notable for the following components:      Result Value   hCG, Beta Chain, Quant, S 22 (*)    All other components within normal limits  ABO/RH   ____________________________________________   EKG    ____________________________________________    RADIOLOGY  [place] Ob Comp < 14 Wks  Result Date: 12/25/2016 CLINICAL DATA:  Initial evaluation for acute vaginal bleeding, early pregnancy. EXAM: OBSTETRIC <14 WK US AND TRANSVAGINAL OB US TECHNIQUE: Both transabdominal and transvaginal ultrasound examinations were performed for complete evaluation of the gestation as well as the maternal uterus, adnexal regions, and pelvic cul-de-sac. Transvaginal technique was performed to assess early pregnancy. COMPARISON:  None. FINDINGS: Intrauterine gestational sac: None visualized. Yolk sac:  Negative Embryo:  Negative Cardiac Activity: N/A Heart Rate: N/A  bpm Subchorionic hemorrhage:  None visualized. Maternal uterus/adnexae: Left ovary normal in appearance. Probable small corpus luteal cyst noted within the right ovary. No adnexal mass. No free fluid. IMPRESSION: 1. Early pregnancy with no discrete IUP or adnexal mass identified. Finding is consistent with a pregnancy of unknown anatomic location. Differential considerations include IUP to early to visualize, recent SAB, or possibly occult ectopic pregnancy. Close clinical monitoring with serial beta HCGs and close interval follow-up ultrasound recommended as clinically warranted. 2. No other acute maternal uterine  or adnexal abnormality identified. Electronically Signed   By: Rise MuBenjamin  McClintock M.D.   On: 12/25/2016 19:52   [place] Ob Transvaginal  Result Date: 12/25/2016 CLINICAL DATA:  Initial evaluation for acute vaginal bleeding, early pregnancy. EXAM: OBSTETRIC <14 WK US AND TRANSVAGINAL OB US TECHNIQUE: Both transabdominal and transvaginal ultrasound examinations were performed for complete evaluation of the gestation as well as the maternal uterus, adnexal regions, and pelvic cul-de-sac. Transvaginal technique was performed to assess early pregnancy. COMPARISON:  None. FINDINGS: Intrauterine gestational sac: None visualized. Yolk sac:  Negative Embryo:  Negative Cardiac Activity: N/A Heart Rate: N/A  bpm Subchorionic hemorrhage:  None visualized. Maternal uterus/adnexae: Left ovary normal in appearance. Probable small corpus luteal cyst noted within the right ovary. No adnexal mass. No free fluid. IMPRESSION: 1. Early pregnancy with no discrete IUP or adnexal mass identified. Finding is consistent with a pregnancy of unknown anatomic location. Differential considerations include IUP to early to visualize, recent SAB, or possibly occult ectopic pregnancy. Close clinical monitoring with serial beta HCGs and close interval follow-up ultrasound recommended as clinically warranted. 2. No other acute  maternal uterine or adnexal abnormality identified. Electronically Signed   By: Rise MuBenjamin  McClintock M.D.   On: 12/25/2016 19:52    ____________________________________________   PROCEDURES Procedures  ____________________________________________   DIFFERENTIAL DIAGNOSIS  Intrauterine pregnancy, spontaneous abortion, ectopic pregnancy  CLINICAL IMPRESSION / ASSESSMENT AND PLAN / ED COURSE  Pertinent labs & imaging results that were available during my care of the patient were reviewed by me and considered in my medical decision making (see chart for details).   Patient well-appearing no acute distress,  unremarkable vital signs. Benign exam. Due to her multiple miscarriages and vaginal bleeding, we'll obtain ultrasound to ensure she does not have an ectopic pregnancy. If she is only [redacted] weeks gestational age at this point, the hCG of 22 may be physiologic and the ultrasound may be nondiagnostic but in that case should be suitable for outpatient follow-up with her prenatal care appointment. Low suspicion of STI PID TOA torsion or appendicitis diverticulitis kidney stone bowel obstruction.   ----------------------------------------- 8:37 PM on 12/25/2016 -----------------------------------------  Ultrasound not able to identify location of pregnancy. Likely due to being very early in gestation. Patient is suitable for outpatient follow-up. Counseled on needing repeat hCG in 2-3 days, follow-up with OB. Continue close clinical monitoring.     ____________________________________________   FINAL CLINICAL IMPRESSION(S) / ED DIAGNOSES    Final diagnoses:  Vaginal bleeding in pregnancy  Threatened miscarriage  Pregnancy of unknown anatomic location      This SmartLink is deprecated. Use AVSMEDLIST instead to display the medication list for a patient.   Portions of this note were generated with dragon dictation software. Dictation errors may occur despite best attempts at proofreading.    Sharman CheekStafford, Nakaiya Beddow, MD 12/25/16 2037

## 2016-12-25 NOTE — ED Notes (Signed)
Patient presents to the ED with vaginal bleeding since around 2pm.  Patient states she took a pregnancy test on Monday that was positive.  Patient states bleeding is mostly light spotting.  Patient is in no obvious distress at this time.  Patient denies pelvic/abdominal pain.

## 2016-12-25 NOTE — Discharge Instructions (Signed)
The ultrasound is unable to identify the location of your pregnancy at this time.  We do not see a tubal pregnancy right now, but since it is very early, we can not be sure where the pregnancy is.  Continue to follow up with your prenatal visit tomorrow, and continue monitoring your symptoms.  Return to the ER if you have severe abdominal pain or bleeding, passing out, or other new concerns.  You should have a repeat blood test (quantitative HCG) in 2-3 days.  Your HCG level today is 22.

## 2016-12-25 NOTE — ED Triage Notes (Addendum)
G9P2.  Pt had home pregnancy test week and half ago. Appointment tomorrow with OBGYN for initial visit.  Started today with pink tinged vaginal discharge/bleeding that she thinks has resolved.  Denies pain.  Has had 3 miscarriage this year and 3 previous abortion. LMP 11/29 but normally the 20th per pt

## 2017-01-20 NOTE — L&D Delivery Note (Signed)
Delivery Note  First Stage: Labor onset: 9/13 at 1900 Augmentation : Pitocin, AROM Analgesia Eliezer Lofts/Anesthesia intrapartum: Epidural AROM 9/14 at 0043   Second Stage: Complete dilation at 0221 Onset of pushing at 0228 FHR second stage: Cat II tracing, variable decels to 80s  Delivery of a viable female infant on 10/03/17 at 0245 by CNM. delivery of fetal head in LOA position with restitution to LOT, no nuchal cord;  Anterior then posterior shoulders delivered easily with gentle downward traction. Baby placed on mom's chest, and attended to by peds.  Cord double clamped after cessation of pulsation, cut by FOB Cord blood sample collected    Third Stage: Active 3rd stage management, birth of infant accompanied by large gush of bloody fluid, placenta delivered spontaneously intact with 3VC @ 0250; eccentric cord insertion noted, few calcifications noted on margins and at cord insertion.  Placenta disposition: routine disposal.  Uterine tone Firm / bleeding scant  Right labial abrasion identified, hemostatic and no repair needed. Est. Blood Loss (mL): 300  Complications: none  Mom to postpartum.  Baby to Couplet care / Skin to Skin.  Newborn: Birth Weight: 7#5  Apgar Scores: 8/9 Feeding planned: formula  McVey, REBECCA A, CNM  10/03/2017 3:11 AM

## 2017-03-16 ENCOUNTER — Other Ambulatory Visit: Payer: Self-pay

## 2017-03-16 ENCOUNTER — Emergency Department
Admission: EM | Admit: 2017-03-16 | Discharge: 2017-03-16 | Disposition: A | Discharge status: Home / Self Care | Payer: Medicaid Other | Attending: Emergency Medicine | Admitting: Emergency Medicine

## 2017-03-16 ENCOUNTER — Encounter: Payer: Self-pay | Admitting: Emergency Medicine

## 2017-03-16 DIAGNOSIS — J45909 Unspecified asthma, uncomplicated: Secondary | ICD-10-CM | POA: Insufficient documentation

## 2017-03-16 DIAGNOSIS — Z87891 Personal history of nicotine dependence: Secondary | ICD-10-CM | POA: Insufficient documentation

## 2017-03-16 DIAGNOSIS — J111 Influenza due to unidentified influenza virus with other respiratory manifestations: Secondary | ICD-10-CM | POA: Diagnosis not present

## 2017-03-16 DIAGNOSIS — Z79899 Other long term (current) drug therapy: Secondary | ICD-10-CM | POA: Diagnosis not present

## 2017-03-16 DIAGNOSIS — R69 Illness, unspecified: Secondary | ICD-10-CM

## 2017-03-16 DIAGNOSIS — R0981 Nasal congestion: Secondary | ICD-10-CM | POA: Diagnosis present

## 2017-03-16 MED ORDER — GUAIFENESIN 200 MG PO TABS: 400.0000 mg | ORAL_TABLET | ORAL | 0 refills | Status: DC | PRN

## 2017-03-16 MED ORDER — OSELTAMIVIR PHOSPHATE 75 MG PO CAPS: 75.0000 mg | ORAL_CAPSULE | Freq: Two times a day (BID) | ORAL | 0 refills | Status: AC

## 2017-03-16 NOTE — ED Triage Notes (Addendum)
Sinus congestion and drainage x 2 days. [redacted] weeks pregnant with no bleeding or fluid.

## 2017-03-16 NOTE — ED Provider Notes (Signed)
[facility] Emergency Department Provider Note  ____________________________________________  Time seen: Approximately 2:08 PM  I have reviewed the triage vital signs and the nursing notes.   HISTORY  Chief Complaint Facial Pain   HPI Linda Hester is a 27 y.o. female who presents to the emergency department for evaluation and treatment of sinus pressure, congestion, and body aches for the past couple of days.  No fever since this morning. Fever yesterday was 101.  She is [redacted] weeks pregnant. G3P2.  No alleviating measures have been attempted for this complaint prior to arrival. Several co-workers have been diagnosed with influenza.   Past Medical History:  Diagnosis Date  . Anemia   . Asthma     Patient Active Problem List   Diagnosis Date Noted  . Fetal growth restriction 02/21/2015  . NST (non-stress test) reactive on fetal surveillance 02/18/2015  . Uterine size date discrepancy 02/08/2015  . Uterine size date discrepancy pregnancy 02/08/2015    Past Surgical History:  Procedure Laterality Date  . NO PAST SURGERIES      Prior to Admission medications   Medication Sig Start Date End Date Taking? Authorizing Provider  albuterol (PROVENTIL HFA;VENTOLIN HFA) 108 (90 Base) MCG/ACT inhaler Inhale 2 puffs into the lungs every 6 (six) hours as needed for wheezing or shortness of breath.    [provider]  ferrous sulfate 325 (65 FE) MG tablet Take 325 mg by mouth daily with breakfast.    [provider]  guaiFENesin 200 MG tablet Take 2 tablets (400 mg total) by mouth every 4 (four) hours as needed for cough or to loosen phlegm. 03/16/17   Maela Takeda, Rulon Eisenmengerari B, FNP  oseltamivir (TAMIFLU) 75 MG capsule Take 1 capsule (75 mg total) by mouth 2 (two) times daily for 5 days. 03/16/17 03/21/17  [name], Yvett Rossel B, FNP    Allergies Shellfish allergy  No family history on file.  Social History Social History   Tobacco Use  . Smoking status:  Former Games developermoker  . Smokeless tobacco: Never Used  Substance Use Topics  . Alcohol use: No  . Drug use: No    Comment: had + UDS 01-09-15 for MJ    Review of Systems Constitutional: Positive for  fever/chills ENT: Negative for sore throat. Cardiovascular: Denies chest pain. Respiratory: Negative for shortness of breath. Positive for cough. Gastrointestinal: Negative for nausea,  no vomiting.  No diarrhea.  Musculoskeletal: Positive for body aches Skin: Negative for rash. Neurological: Positive for headaches ____________________________________________   PHYSICAL EXAM:  VITAL SIGNS: ED Triage Vitals  Enc Vitals Group     BP 03/16/17 1218 136/81     Pulse Rate 03/16/17 1218 (!) 125     Resp 03/16/17 1218 16     Temp 03/16/17 1218 98.4 F (36.9 C)     Temp Source 03/16/17 1218 Oral     SpO2 03/16/17 1218 100 %     Weight 03/16/17 1218 184 lb (83.5 kg)     Height 03/16/17 1218 5\' 9"  (1.753 m)     Head Circumference --      Peak Flow --      Pain Score 03/16/17 1223 3     Pain Loc --      Pain Edu? --      Excl. in GC? --     Constitutional: Alert and oriented. Acutely ill appearing and in no acute distress. Eyes: Conjunctivae are normal. EOMI. Ears: Bilateral TM injected with mild erythema. Nose: Sinus congestion noted;  no rhinnorhea. Mouth/Throat: Mucous membranes are moist.  Oropharynx normal. Tonsils not visualized. Neck: No stridor.  Lymphatic: Bilateral anterior cervical lymphadenopathy. Cardiovascular: Normal rate, regular rhythm. Good peripheral circulation. Respiratory: Normal respiratory effort.  No retractions. Breath sounds clear to auscultation. Gastrointestinal: Soft and nontender.  Musculoskeletal: FROM x 4 extremities.  Neurologic:  Normal speech and language.  Skin:  Skin is warm, dry and intact. No rash noted. Psychiatric: Mood and affect are normal. Speech and behavior are normal.  ____________________________________________   LABS (all labs  ordered are listed, but only abnormal results are displayed)  Labs Reviewed - No data to display ____________________________________________  EKG  Not indicated. ____________________________________________  RADIOLOGY  Not indicated. ____________________________________________   PROCEDURES  Procedure(s) performed: None  Critical Care performed: No ____________________________________________   INITIAL IMPRESSION / ASSESSMENT AND PLAN / ED COURSE  27 year old female presenting to the emergency department for treatment and evaluations of symptoms and exam most consistent with influenza.  She has had significant exposure and she is [redacted] weeks pregnant.  She will be treated empirically with Tamiflu.  She was also given a prescription for guaifenesin for the sinus congestion and cough.  She was also advised to take Tylenol for body aches or fever if needed.  She was instructed to follow-up with her OB/GYN for symptoms did not seem to be improving over the week.  She will be given a work note for the next 2 days.  She was encouraged to return to the emergency department for symptoms of change or worsen if she is unable to schedule appointment.  Medications - No data to display  ED Discharge Orders        Ordered    oseltamivir (TAMIFLU) 75 MG capsule  2 times daily     03/16/17 1420    guaiFENesin 200 MG tablet  Every 4 hours PRN     03/16/17 1420      Pertinent labs & imaging results that were available during my care of the patient were reviewed by me and considered in my medical decision making (see chart for details).    If controlled substance prescribed during this visit, 12 month history viewed on the [facility] prior to issuing an initial prescription for Schedule II or III opiod. ____________________________________________   FINAL CLINICAL IMPRESSION(S) / ED DIAGNOSES  Final diagnoses:  Influenza-like illness    Note:  This document was prepared using Dragon voice  recognition software and may include unintentional dictation errors.     Chinita Pester, FNP 03/16/17 1428    Governor Rooks, MD 03/16/17 [id number]

## 2017-03-16 NOTE — ED Notes (Signed)
See triage note  Presents with sinus pressure and congestion for couple of days afebrile on arrival

## 2017-03-20 LAB — OB RESULTS CONSOLE RUBELLA ANTIBODY, IGM: RUBELLA: IMMUNE

## 2017-03-20 LAB — OB RESULTS CONSOLE VARICELLA ZOSTER ANTIBODY, IGG: Varicella: IMMUNE

## 2017-03-20 LAB — OB RESULTS CONSOLE HEPATITIS B SURFACE ANTIGEN: Hepatitis B Surface Ag: NEGATIVE

## 2017-03-21 LAB — OB RESULTS CONSOLE GC/CHLAMYDIA
CHLAMYDIA, DNA PROBE: NEGATIVE
GC PROBE AMP, GENITAL: NEGATIVE

## 2017-04-13 ENCOUNTER — Other Ambulatory Visit: Payer: Self-pay

## 2017-04-13 ENCOUNTER — Emergency Department
Admission: EM | Admit: 2017-04-13 | Discharge: 2017-04-13 | Disposition: A | Discharge status: Home / Self Care | Payer: Medicaid Other | Attending: Emergency Medicine | Admitting: Emergency Medicine

## 2017-04-13 DIAGNOSIS — J069 Acute upper respiratory infection, unspecified: Secondary | ICD-10-CM | POA: Insufficient documentation

## 2017-04-13 DIAGNOSIS — Z79899 Other long term (current) drug therapy: Secondary | ICD-10-CM | POA: Diagnosis not present

## 2017-04-13 DIAGNOSIS — O9989 Other specified diseases and conditions complicating pregnancy, childbirth and the puerperium: Secondary | ICD-10-CM | POA: Insufficient documentation

## 2017-04-13 DIAGNOSIS — R0981 Nasal congestion: Secondary | ICD-10-CM | POA: Insufficient documentation

## 2017-04-13 DIAGNOSIS — R51 Headache: Secondary | ICD-10-CM | POA: Insufficient documentation

## 2017-04-13 DIAGNOSIS — R112 Nausea with vomiting, unspecified: Secondary | ICD-10-CM | POA: Diagnosis not present

## 2017-04-13 DIAGNOSIS — J45909 Unspecified asthma, uncomplicated: Secondary | ICD-10-CM | POA: Diagnosis not present

## 2017-04-13 DIAGNOSIS — Z3A20 20 weeks gestation of pregnancy: Secondary | ICD-10-CM | POA: Diagnosis not present

## 2017-04-13 DIAGNOSIS — Z87891 Personal history of nicotine dependence: Secondary | ICD-10-CM | POA: Diagnosis not present

## 2017-04-13 DIAGNOSIS — R197 Diarrhea, unspecified: Secondary | ICD-10-CM | POA: Insufficient documentation

## 2017-04-13 DIAGNOSIS — R05 Cough: Secondary | ICD-10-CM | POA: Diagnosis not present

## 2017-04-13 MED ORDER — SODIUM CHLORIDE 0.9 % IV BOLUS
1000.0000 mL | Freq: Once | INTRAVENOUS | Status: AC
Start: 2017-04-13 — End: 2017-04-13
  Administered 2017-04-13: 1000 mL via INTRAVENOUS

## 2017-04-13 MED ORDER — METOCLOPRAMIDE HCL 5 MG/ML IJ SOLN
10.0000 mg | Freq: Once | INTRAMUSCULAR | Status: AC
  Administered 2017-04-13: 10 mg via INTRAVENOUS
  Filled 2017-04-13: qty 2

## 2017-04-13 NOTE — Discharge Instructions (Addendum)
As we discussed please drink plenty fluids of the next several days.  Take Tylenol as needed for headache or discomfort as written on the box.  It is also safe to take pseudoephedrine as needed for congestion as written on the box.  Return to the emergency department for any worsening headache, development of fever, vomiting unable to keep down fluids, or any other symptom personally concerning to yourself.

## 2017-04-13 NOTE — ED Triage Notes (Addendum)
Pt arrives to ED via POV with c/o "migraine" headache x2 weeks. Pt reports being told by [facility] to take Tylenol, and if ineffective to report to ED for "testing for stress to make she the baby is okay because they can't do that there". Pt reports (+) V/D, unsure of fever at home. Last dose of OTC medication taken at 3pm today. Pt denies any c/o pain at this time, no pregnancy related s/x's. Pt is A&O, in NAD; RR even, regular, and unlabored.

## 2017-04-13 NOTE — ED Provider Notes (Signed)
[facility] Emergency Department Provider Note  Time seen: 11:02 PM  I have reviewed the triage vital signs and the nursing notes.   HISTORY  Chief Complaint Migraine    HPI Linda Hester is a 27 y.o. female with a past medical history of anemia, asthma, proximal [redacted] weeks pregnant who presents to the emergency department for cough, congestion headache, nausea vomiting and diarrhea.  According to the patient throughout the pregnancy she has been expensing intermittent headaches, states that is nothing new for her.  However this morning she awoke with cough and congestion.  Patient states she was nauseated and spit up a little bit this morning as well.  Also had a episode of loose stool today.  States continued congestion with occasional cough.  Patient called the health department they asked the patient to go to the ER to make sure the baby was okay.  Patient denies any abdominal pain, vaginal bleeding discharge or fluid leakage.  No dysuria.  No chest pain or trouble breathing.  Largely negative review of systems otherwise.   Past Medical History:  Diagnosis Date  . Anemia   . Asthma     Patient Active Problem List   Diagnosis Date Noted  . Fetal growth restriction 02/21/2015  . NST (non-stress test) reactive on fetal surveillance 02/18/2015  . Uterine size date discrepancy 02/08/2015  . Uterine size date discrepancy pregnancy 02/08/2015    Past Surgical History:  Procedure Laterality Date  . NO PAST SURGERIES      Prior to Admission medications   Medication Sig Start Date End Date Taking? Authorizing Provider  albuterol (PROVENTIL HFA;VENTOLIN HFA) 108 (90 Base) MCG/ACT inhaler Inhale 2 puffs into the lungs every 6 (six) hours as needed for wheezing or shortness of breath.    [provider]  ferrous sulfate 325 (65 FE) MG tablet Take 325 mg by mouth daily with breakfast.    [provider]  guaiFENesin 200 MG tablet Take 2 tablets  (400 mg total) by mouth every 4 (four) hours as needed for cough or to loosen phlegm. 03/16/17   [name], Cari B, FNP    Allergies  Allergen Reactions  . Shellfish Allergy Anaphylaxis    No family history on file.  Social History Social History   Tobacco Use  . Smoking status: Former Games developermoker  . Smokeless tobacco: Never Used  Substance Use Topics  . Alcohol use: No  . Drug use: No    Comment: had + UDS 01-09-15 for MJ    Review of Systems Constitutional: Negative for fever. Eyes: Negative for visual complaints ENT: Mild congestion Cardiovascular: Negative for chest pain. Respiratory: Negative for shortness of breath.  Occasional cough today Gastrointestinal: Negative for abdominal pain.  Nausea with one episode of vomiting/spitting up earlier today.  Loose stool. Genitourinary: Negative for urinary compaints Musculoskeletal: Negative for musculoskeletal complaints Skin: Negative for skin complaints  Neurological: Mild headache, intermittent she states over the past 20 weeks. All other ROS negative  ____________________________________________   PHYSICAL EXAM:  VITAL SIGNS: ED Triage Vitals  Enc Vitals Group     BP 04/13/17 2020 (!) 141/75     Pulse Rate 04/13/17 2020 (!) 107     Resp 04/13/17 2020 18     Temp 04/13/17 2020 98.7 F (37.1 C)     Temp Source 04/13/17 2020 Oral     SpO2 04/13/17 2020 100 %     Weight 04/13/17 2020 174 lb (78.9 kg)  Height 04/13/17 2020 5\' 9"  (1.753 m)     Head Circumference --      Peak Flow --      Pain Score 04/13/17 2026 0     Pain Loc --      Pain Edu? --      Excl. in GC? --     Constitutional: Alert and oriented. Well appearing and in no distress. Eyes: Normal exam ENT   Head: Normocephalic and atraumatic.   Mouth/Throat: Mucous membranes are moist. Cardiovascular: Normal rate, regular rhythm. No murmur Respiratory: Normal respiratory effort without tachypnea nor retractions. Breath sounds are  clear Gastrointestinal: Soft, gravid lower abdomen, nontender. Musculoskeletal: Nontender with normal range of motion in all extremities.  Neurologic:  Normal speech and language. No gross focal neurologic deficits Skin:  Skin is warm, dry and intact.  Psychiatric: Mood and affect are normal.    INITIAL IMPRESSION / ASSESSMENT AND PLAN / ED COURSE  Pertinent labs & imaging results that were available during my care of the patient were reviewed by me and considered in my medical decision making (see chart for details).  Patient presents to the emergency department for congestion with cough today intermittent headache with an episode of vomiting diarrhea.  Overall the patient appears very well, no distress, normal physical exam.  Given the patient's symptoms we will place an IV, IV hydrate and treat with a one-time dose of Reglan.  Patient's exam is most consistent with a mild upper respiratory infection.  I discussed safe medications she could take including Tylenol and pseudoephedrine if needed.  I performed a bedside ultrasound, showing a heart rate of 171 bpm on M-mode, great fetal movement, no concerning findings. After Reglan patient states the nausea is gone, she is feeling much better after the fluid (approximately 500 cc remaining).  Patient states she has follow-up appointment this week with her doctor.  Patient will be discharged from the emergency department.  I discussed return precautions for any worsening headache development of fever or vomiting unable to keep down fluids.  ____________________________________________   FINAL CLINICAL IMPRESSION(S) / ED DIAGNOSES  Upper respiratory infection     Minna Antis, MD 04/13/17 2307

## 2017-07-13 LAB — OB RESULTS CONSOLE HIV ANTIBODY (ROUTINE TESTING): HIV: NONREACTIVE

## 2017-07-30 DIAGNOSIS — A6000 Herpesviral infection of urogenital system, unspecified: Secondary | ICD-10-CM | POA: Insufficient documentation

## 2017-08-01 ENCOUNTER — Observation Stay
Admission: EM | Admit: 2017-08-01 | Discharge: 2017-08-01 | Disposition: A | Discharge status: Home / Self Care | Payer: Medicaid Other | Attending: Obstetrics and Gynecology | Admitting: Obstetrics and Gynecology

## 2017-08-01 DIAGNOSIS — O26893 Other specified pregnancy related conditions, third trimester: Secondary | ICD-10-CM | POA: Diagnosis not present

## 2017-08-01 DIAGNOSIS — O99513 Diseases of the respiratory system complicating pregnancy, third trimester: Secondary | ICD-10-CM | POA: Diagnosis not present

## 2017-08-01 DIAGNOSIS — Z79899 Other long term (current) drug therapy: Secondary | ICD-10-CM | POA: Diagnosis not present

## 2017-08-01 DIAGNOSIS — Z3A31 31 weeks gestation of pregnancy: Secondary | ICD-10-CM | POA: Insufficient documentation

## 2017-08-01 DIAGNOSIS — J45909 Unspecified asthma, uncomplicated: Secondary | ICD-10-CM | POA: Insufficient documentation

## 2017-08-01 DIAGNOSIS — D649 Anemia, unspecified: Secondary | ICD-10-CM | POA: Insufficient documentation

## 2017-08-01 DIAGNOSIS — K5900 Constipation, unspecified: Secondary | ICD-10-CM | POA: Diagnosis not present

## 2017-08-01 DIAGNOSIS — O99013 Anemia complicating pregnancy, third trimester: Secondary | ICD-10-CM | POA: Diagnosis not present

## 2017-08-01 DIAGNOSIS — O99613 Diseases of the digestive system complicating pregnancy, third trimester: Secondary | ICD-10-CM | POA: Diagnosis not present

## 2017-08-01 DIAGNOSIS — F17210 Nicotine dependence, cigarettes, uncomplicated: Secondary | ICD-10-CM | POA: Diagnosis not present

## 2017-08-01 DIAGNOSIS — O99333 Smoking (tobacco) complicating pregnancy, third trimester: Secondary | ICD-10-CM | POA: Diagnosis not present

## 2017-08-01 NOTE — Progress Notes (Signed)
Signed         G7P2 pt presents to [facility] due to upper abdominal pain that began last night and is worse with movement. She said she first noticed it "after I was bent over doing my hair." She describes it as sharp pain rating 8/10 and is constant but "less" while laying down.

## 2017-08-01 NOTE — Discharge Instructions (Signed)
Over-the-counter medication: Colace (Docusate Sodium)   Constipation, Adult Constipation is when a person has fewer bowel movements in a week than normal, has difficulty having a bowel movement, or has stools that are dry, hard, or larger than normal. Constipation may be caused by an underlying condition. It may become worse with age if a person takes certain medicines and does not take in enough fluids. Follow these instructions at home: Eating and drinking   Eat foods that have a lot of fiber, such as fresh fruits and vegetables, whole grains, and beans.  Limit foods that are high in fat, low in fiber, or overly processed, such as french fries, hamburgers, cookies, candies, and soda.  Drink enough fluid to keep your urine clear or pale yellow. General instructions  Exercise regularly or as told by your health care provider.  Go to the restroom when you have the urge to go. Do not hold it in.  Take over-the-counter and prescription medicines only as told by your health care provider. These include any fiber supplements.  Practice pelvic floor retraining exercises, such as deep breathing while relaxing the lower abdomen and pelvic floor relaxation during bowel movements.  Watch your condition for any changes.  Keep all follow-up visits as told by your health care provider. This is important. Contact a health care provider if:  You have pain that gets worse.  You have a fever.  You do not have a bowel movement after 4 days.  You vomit.  You are not hungry.  You lose weight.  You are bleeding from the anus.  You have thin, pencil-like stools. Get help right away if:  You have a fever and your symptoms suddenly get worse.  You leak stool or have blood in your stool.  Your abdomen is bloated.  You have severe pain in your abdomen.  You feel dizzy or you faint. This information is not intended to replace advice given to you by your health care provider. Make sure you  discuss any questions you have with your health care provider. Document Released: 10/05/2003 Document Revised: 07/27/2015 Document Reviewed: 06/27/2015 Elsevier Interactive Patient Education  2018 Elsevier Inc.   High-Fiber Diet Fiber, also called dietary fiber, is a type of carbohydrate found in fruits, vegetables, whole grains, and beans. A high-fiber diet can have many health benefits. Your health care provider may recommend a high-fiber diet to help:  Prevent constipation. Fiber can make your bowel movements more regular.  Lower your cholesterol.  Relieve hemorrhoids, uncomplicated diverticulosis, or irritable bowel syndrome.  Prevent overeating as part of a weight-loss plan.  Prevent heart disease, type 2 diabetes, and certain cancers.  What is my plan? The recommended daily intake of fiber includes:  38 grams for men under age 28.  30 grams for men over age 28.  25 grams for women under age 28.  21 grams for women over age 28.  You can get the recommended daily intake of dietary fiber by eating a variety of fruits, vegetables, grains, and beans. Your health care provider may also recommend a fiber supplement if it is not possible to get enough fiber through your diet. What do I need to know about a high-fiber diet?  Fiber supplements have not been widely studied for their effectiveness, so it is better to get fiber through food sources.  Always check the fiber content on thenutrition facts label of any prepackaged food. Look for foods that contain at least 5 grams of fiber per serving.  Ask your dietitian if you have questions about specific foods that are related to your condition, especially if those foods are not listed in the following section.  Increase your daily fiber consumption gradually. Increasing your intake of dietary fiber too quickly may cause bloating, cramping, or gas.  Drink plenty of water. Water helps you to digest fiber. What foods can I  eat? Grains Whole-grain breads. Multigrain cereal. Oats and oatmeal. Brown rice. Barley. Bulgur wheat. Millet. Bran muffins. Popcorn. Rye wafer crackers. Vegetables Sweet potatoes. Spinach. Kale. Artichokes. Cabbage. Broccoli. Green peas. Carrots. Squash. Fruits Berries. Pears. Apples. Oranges. Avocados. Prunes and raisins. Dried figs. Meats and Other Protein Sources Navy, kidney, pinto, and soy beans. Split peas. Lentils. Nuts and seeds. Dairy Fiber-fortified yogurt. Beverages Fiber-fortified soy milk. Fiber-fortified orange juice. Other Fiber bars. The items listed above may not be a complete list of recommended foods or beverages. Contact your dietitian for more options. What foods are not recommended? Grains White bread. Pasta made with refined flour. White rice. Vegetables Fried potatoes. Canned vegetables. Well-cooked vegetables. Fruits Fruit juice. Cooked, strained fruit. Meats and Other Protein Sources Fatty cuts of meat. Fried Environmental education officer or fried fish. Dairy Milk. Yogurt. Cream cheese. Sour cream. Beverages Soft drinks. Other Cakes and pastries. Butter and oils. The items listed above may not be a complete list of foods and beverages to avoid. Contact your dietitian for more information. What are some tips for including high-fiber foods in my diet?  Eat a wide variety of high-fiber foods.  Make sure that half of all grains consumed each day are whole grains.  Replace breads and cereals made from refined flour or white flour with whole-grain breads and cereals.  Replace white rice with brown rice, bulgur wheat, or millet.  Start the day with a breakfast that is high in fiber, such as a cereal that contains at least 5 grams of fiber per serving.  Use beans in place of meat in soups, salads, or pasta.  Eat high-fiber snacks, such as berries, raw vegetables, nuts, or popcorn. This information is not intended to replace advice given to you by your health care provider.  Make sure you discuss any questions you have with your health care provider. Document Released: 01/06/2005 Document Revised: 06/14/2015 Document Reviewed: 06/21/2013 Elsevier Interactive Patient Education  [employer].

## 2017-08-01 NOTE — Progress Notes (Signed)
Patient ID: Linda Hester, female   DOB: 09-Aug-1990, 27 y.o.   MRN: [id number]  Linda Hester is a 27 y.o. female. She is at 7621w4d gestation. Patient's last menstrual period was 12/18/2016. Estimated Date of Delivery: 09/29/17  Prenatal care site: [facility]   Chief complaint: pain this am in the upper abd all along the top part of the uterus, feels like it may be gas pains  Location:Upper abd from lateral right to lat left Onset/timing:Thsi am Duration:Intermittant and improved now Quality: mod pain in the upper abd  Severity:Hurts but, is improved now Aggravating or alleviating conditions:Cannot have BM Associated signs/symptoms:no N,V,D Context:Started on Fe for anemia the other day   S: Resting comfortably. no CTX, no VB.no LOF,  Active fetal movement.+  Maternal Medical History:   Past Medical History:  Diagnosis Date  . Anemia   . Asthma     Past Surgical History:  Procedure Laterality Date  . NO PAST SURGERIES      Allergies  Allergen Reactions  . Shellfish Allergy Anaphylaxis    Prior to Admission medications   Medication Sig Start Date End Date Taking? Authorizing Provider  ferrous sulfate 325 (65 FE) MG tablet Take 325 mg by mouth daily with breakfast.   Yes [provider]  albuterol (PROVENTIL HFA;VENTOLIN HFA) 108 (90 Base) MCG/ACT inhaler Inhale 2 puffs into the lungs every 6 (six) hours as needed for wheezing or shortness of breath.    [provider]  guaiFENesin 200 MG tablet Take 2 tablets (400 mg total) by mouth every 4 (four) hours as needed for cough or to loosen phlegm. Patient not taking: Reported on 08/01/2017 03/16/17   Linda Hester, Cari B, FNP     Social History: She  reports that she has been smoking cigarettes.  She has never used smokeless tobacco. She reports that she does not drink alcohol or use drugs.  Family History:family history is not on file.  Review of Systems: A full review of systems was performed and negative except as  noted in the HPI.     O:  BP 115/67   Pulse 95   Temp 98.2 F (36.8 C) (Oral)   Resp 18   Ht 5\' 9"  (1.753 m)   Wt 85.3 kg (188 lb)   LMP 12/18/2016   BMI 27.76 kg/m  No results found for this or any previous visit (from the past 48 hour(s)).   Constitutional: NAD, AAOx3  HE/ENT: extraocular movements grossly intact, moist mucous membranes CV: RRR PULM: nl respiratory effort, CTABL     Abd: gravid, non-tender, non-distended, soft      Ext: Non-tender, Nonedmeatous   Psych: mood appropriate, speech normal Pelvic deferred  NST: Reactive NST with 2 accels 15 x 15 BPM Baseline: 150  Variability: moderate Accelerations present x >2 Decelerations absent Time 20mins    A/P: 27 y.o. 6621w4d here for antenatal surveillance for upper abd pain due to constipation and gas pains  Labor: not present.   Fetal Wellbeing: Reassuring Cat 1 tracing.  Reactive NST   D/c home stable, precautions reviewed, follow-up as scheduled.   ----- Myrtie Cruisearon W. Jones,RN, MSN, CNM, FNP Certified Nurse Midwife [facility]

## 2017-09-07 ENCOUNTER — Observation Stay
Admission: EM | Admit: 2017-09-07 | Discharge: 2017-09-07 | Disposition: A | Discharge status: Home / Self Care | Payer: Medicaid Other | Attending: Obstetrics and Gynecology | Admitting: Obstetrics and Gynecology

## 2017-09-07 DIAGNOSIS — R109 Unspecified abdominal pain: Secondary | ICD-10-CM | POA: Diagnosis not present

## 2017-09-07 DIAGNOSIS — Z3490 Encounter for supervision of normal pregnancy, unspecified, unspecified trimester: Secondary | ICD-10-CM

## 2017-09-07 DIAGNOSIS — M549 Dorsalgia, unspecified: Secondary | ICD-10-CM | POA: Insufficient documentation

## 2017-09-07 DIAGNOSIS — R51 Headache: Secondary | ICD-10-CM | POA: Insufficient documentation

## 2017-09-07 DIAGNOSIS — Z3A36 36 weeks gestation of pregnancy: Secondary | ICD-10-CM | POA: Diagnosis not present

## 2017-09-07 DIAGNOSIS — O26893 Other specified pregnancy related conditions, third trimester: Principal | ICD-10-CM | POA: Insufficient documentation

## 2017-09-07 LAB — URINALYSIS, ROUTINE W REFLEX MICROSCOPIC
BACTERIA UA: NONE SEEN
Bilirubin Urine: NEGATIVE
Glucose, UA: NEGATIVE mg/dL
HGB URINE DIPSTICK: NEGATIVE
Ketones, ur: NEGATIVE mg/dL
Leukocytes, UA: NEGATIVE
Nitrite: NEGATIVE
PROTEIN: NEGATIVE mg/dL
SPECIFIC GRAVITY, URINE: 1.012 (ref 1.005–1.030)
pH: 7 (ref 5.0–8.0)

## 2017-09-07 LAB — PROTEIN / CREATININE RATIO, URINE
CREATININE, URINE: 123 mg/dL
PROTEIN CREATININE RATIO: 0.08 mg/mg{creat} (ref 0.00–0.15)
TOTAL PROTEIN, URINE: 10 mg/dL

## 2017-09-07 NOTE — Discharge Instructions (Signed)
Please call or return [facility] the [facility] with any of the following concerns: Vaginal Bleeding Decreased Fetal Movement Contractions (Every 3-375mins over an hour) Leaking of Fluid

## 2017-09-07 NOTE — Final Progress Note (Signed)
TRIAGE VISIT with NST   Makya Nourse is a 27 y.o. [id number]. She is at 4968w6d gestation.  Indication: Bilateral leg pain that made her feel weak and has now resolved; headache daily that resolves for 1 hr with Tylenol but sometimes makes her dizzy. She is a smoker  S: Resting comfortably. no CTX, no VB. Active fetal movement. Not leaking fluid. O:  BP 127/79 (BP Location: Left Arm)   Pulse 87   Temp 98.1 F (36.7 C) (Oral)   Resp 18   Ht 5\' 9"  (1.753 m)   Wt 84.8 kg   LMP 12/18/2016   BMI 27.62 kg/m  No results found for this or any previous visit (from the past 48 hour(s)).   Gen: NAD, AAOx3      Abd: FNTTP      Ext: Non-tender, Nonedmeatous    FHT: 140, moderate variability, no decels, +accels 15x15 TOCO: quiet SVE:  deferred   A/P:  27 y.o. [id number] 6268w6d with abdominal pain that has now resolved, headaches resolved temporarily with tylenol and no fetal complaints.   Labor: not present.   Will get urine for hydration status, protein:creatinine ratio  Fetal Wellbeing: NST is Reassuring Cat 1 tracing.  D/c home stable, precautions reviewed, follow-up as scheduled.

## 2017-09-07 NOTE — OB Triage Note (Signed)
Pt [id number] presents at 6958w6d with complaints of abdominal/back pian and HA. Abdominal pain comes and goes and does not occur everyday. Pt has had HA daily since last month. Pt states she has had some clear discharge starting 2-3 days ago. VSS. Monitors applied.

## 2017-09-11 LAB — OB RESULTS CONSOLE GBS: GBS: NEGATIVE

## 2017-09-13 LAB — OB RESULTS CONSOLE GBS: STREP GROUP B AG: NEGATIVE

## 2017-10-02 ENCOUNTER — Inpatient Hospital Stay: Payer: Medicaid Other | Admitting: Anesthesiology

## 2017-10-02 ENCOUNTER — Encounter: Payer: Self-pay | Admitting: Anesthesiology

## 2017-10-02 ENCOUNTER — Inpatient Hospital Stay
Admission: EM | Admit: 2017-10-02 | Discharge: 2017-10-04 | DRG: 806 | Disposition: A | Discharge status: Home / Self Care | Payer: Medicaid Other | Attending: Obstetrics and Gynecology | Admitting: Obstetrics and Gynecology

## 2017-10-02 ENCOUNTER — Other Ambulatory Visit: Payer: Self-pay

## 2017-10-02 DIAGNOSIS — J45909 Unspecified asthma, uncomplicated: Secondary | ICD-10-CM | POA: Diagnosis present

## 2017-10-02 DIAGNOSIS — F17210 Nicotine dependence, cigarettes, uncomplicated: Secondary | ICD-10-CM | POA: Diagnosis present

## 2017-10-02 DIAGNOSIS — O43123 Velamentous insertion of umbilical cord, third trimester: Secondary | ICD-10-CM | POA: Diagnosis present

## 2017-10-02 DIAGNOSIS — F1290 Cannabis use, unspecified, uncomplicated: Secondary | ICD-10-CM | POA: Diagnosis present

## 2017-10-02 DIAGNOSIS — O9952 Diseases of the respiratory system complicating childbirth: Secondary | ICD-10-CM | POA: Diagnosis present

## 2017-10-02 DIAGNOSIS — O9081 Anemia of the puerperium: Secondary | ICD-10-CM | POA: Diagnosis not present

## 2017-10-02 DIAGNOSIS — A6000 Herpesviral infection of urogenital system, unspecified: Secondary | ICD-10-CM | POA: Diagnosis present

## 2017-10-02 DIAGNOSIS — D62 Acute posthemorrhagic anemia: Secondary | ICD-10-CM | POA: Diagnosis not present

## 2017-10-02 DIAGNOSIS — O9832 Other infections with a predominantly sexual mode of transmission complicating childbirth: Secondary | ICD-10-CM | POA: Diagnosis present

## 2017-10-02 DIAGNOSIS — O99324 Drug use complicating childbirth: Secondary | ICD-10-CM | POA: Diagnosis present

## 2017-10-02 DIAGNOSIS — Z3483 Encounter for supervision of other normal pregnancy, third trimester: Secondary | ICD-10-CM | POA: Diagnosis present

## 2017-10-02 DIAGNOSIS — O99334 Smoking (tobacco) complicating childbirth: Secondary | ICD-10-CM | POA: Diagnosis present

## 2017-10-02 DIAGNOSIS — Z3A39 39 weeks gestation of pregnancy: Secondary | ICD-10-CM | POA: Diagnosis not present

## 2017-10-02 LAB — CBC
HCT: 29.4 % — ABNORMAL LOW (ref 35.0–47.0)
Hemoglobin: 10.1 g/dL — ABNORMAL LOW (ref 12.0–16.0)
MCH: 29.7 pg (ref 26.0–34.0)
MCHC: 34.5 g/dL (ref 32.0–36.0)
MCV: 85.9 fL (ref 80.0–100.0)
PLATELETS: 206 10*3/uL (ref 150–440)
RBC: 3.42 MIL/uL — AB (ref 3.80–5.20)
RDW: 15 % — AB (ref 11.5–14.5)
WBC: 10.9 10*3/uL (ref 3.6–11.0)

## 2017-10-02 LAB — URINE DRUG SCREEN, QUALITATIVE (ARMC ONLY)
Amphetamines, Ur Screen: NOT DETECTED
Barbiturates, Ur Screen: NOT DETECTED
Benzodiazepine, Ur Scrn: NOT DETECTED
CANNABINOID 50 NG, UR ~~LOC~~: NOT DETECTED
COCAINE METABOLITE, UR ~~LOC~~: NOT DETECTED
MDMA (Ecstasy)Ur Screen: NOT DETECTED
Methadone Scn, Ur: NOT DETECTED
OPIATE, UR SCREEN: NOT DETECTED
PHENCYCLIDINE (PCP) UR S: NOT DETECTED
TRICYCLIC, UR SCREEN: NOT DETECTED

## 2017-10-02 LAB — TYPE AND SCREEN
ABO/RH(D): AB POS
Antibody Screen: NEGATIVE

## 2017-10-02 MED ORDER — FENTANYL CITRATE (PF) 100 MCG/2ML IJ SOLN: 50.0000 ug | INTRAMUSCULAR | Status: DC | PRN

## 2017-10-02 MED ORDER — OXYTOCIN 10 UNIT/ML IJ SOLN
INTRAMUSCULAR | Status: AC
  Filled 2017-10-02: qty 2

## 2017-10-02 MED ORDER — SOD CITRATE-CITRIC ACID 500-334 MG/5ML PO SOLN: 30.0000 mL | ORAL | Status: DC | PRN

## 2017-10-02 MED ORDER — OXYTOCIN BOLUS FROM INFUSION
500.0000 mL | Freq: Once | INTRAVENOUS | Status: AC
  Administered 2017-10-03: 500 mL via INTRAVENOUS

## 2017-10-02 MED ORDER — ONDANSETRON HCL 4 MG/2ML IJ SOLN: 4.0000 mg | Freq: Four times a day (QID) | INTRAMUSCULAR | Status: DC | PRN

## 2017-10-02 MED ORDER — FENTANYL 2.5 MCG/ML W/ROPIVACAINE 0.15% IN NS 100 ML EPIDURAL (ARMC)
EPIDURAL | Status: AC
  Filled 2017-10-02: qty 100

## 2017-10-02 MED ORDER — OXYTOCIN 40 UNITS IN LACTATED RINGERS INFUSION - SIMPLE MED
1.0000 m[IU]/min | INTRAVENOUS | Status: DC
  Administered 2017-10-02: 2 m[IU]/min via INTRAVENOUS
  Filled 2017-10-02: qty 1000

## 2017-10-02 MED ORDER — ACETAMINOPHEN 325 MG PO TABS: 650.0000 mg | ORAL_TABLET | ORAL | Status: DC | PRN

## 2017-10-02 MED ORDER — AMMONIA AROMATIC IN INHA
RESPIRATORY_TRACT | Status: AC
  Filled 2017-10-02: qty 10

## 2017-10-02 MED ORDER — LIDOCAINE HCL (PF) 1 % IJ SOLN: 30.0000 mL | INTRAMUSCULAR | Status: DC | PRN

## 2017-10-02 MED ORDER — LACTATED RINGERS IV SOLN
INTRAVENOUS | Status: DC
  Administered 2017-10-02 – 2017-10-03 (×3): via INTRAVENOUS

## 2017-10-02 MED ORDER — MISOPROSTOL 200 MCG PO TABS
ORAL_TABLET | ORAL | Status: AC
  Filled 2017-10-02: qty 4

## 2017-10-02 MED ORDER — LACTATED RINGERS IV SOLN
500.0000 mL | INTRAVENOUS | Status: DC | PRN
  Administered 2017-10-02 – 2017-10-03 (×2): 500 mL via INTRAVENOUS

## 2017-10-02 MED ORDER — TERBUTALINE SULFATE 1 MG/ML IJ SOLN: 0.2500 mg | Freq: Once | INTRAMUSCULAR | Status: DC | PRN

## 2017-10-02 MED ORDER — LIDOCAINE HCL (PF) 1 % IJ SOLN
INTRAMUSCULAR | Status: AC
  Filled 2017-10-02: qty 30

## 2017-10-02 MED ORDER — OXYTOCIN 40 UNITS IN LACTATED RINGERS INFUSION - SIMPLE MED
2.5000 [IU]/h | INTRAVENOUS | Status: DC
  Administered 2017-10-03: 2.5 [IU]/h via INTRAVENOUS

## 2017-10-02 NOTE — H&P (Addendum)
OB History & Physical   History of Present Illness:  Chief Complaint:   HPI:  Linda Hester is a 27 y.o. [id number] female at 39+3wk dated by US at [redacted]wks GA.  She presents to L&D for pelvic pressure and irreg UCs. Reports active FM, denies VB, LOF.  - cervical change since arrival in triage from 3 to 4cm    Pregnancy Issues: 1. MJ use during pregnancy 2. Anemia  3. UTI 1st tri, strep mitis/oralis 4. Asthma, seasonal allergies 5. 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 Anemia   . Asthma     Past Surgical History:  Procedure Laterality Date  . NO PAST SURGERIES      Allergies  Allergen Reactions  . Shellfish Allergy Anaphylaxis    Prior to Admission medications   Medication Sig Start Date End Date Taking? Authorizing Provider  valACYclovir (VALTREX) 1000 MG tablet Take 1,000 mg by mouth 2 (two) times daily.   Yes [provider]  albuterol (PROVENTIL HFA;VENTOLIN HFA) 108 (90 Base) MCG/ACT inhaler Inhale 2 puffs into the lungs every 6 (six) hours as needed for wheezing or shortness of breath.    [provider]  ferrous sulfate 325 (65 FE) MG tablet Take 325 mg by mouth daily with breakfast.    [provider]  guaiFENesin 200 MG tablet Take 2 tablets (400 mg total) by mouth every 4 (four) hours as needed for cough or to loosen phlegm. Patient not taking: Reported on 08/01/2017 03/16/17   [name], Cari B, FNP     [facility]   Social History: She  reports that she has been smoking cigarettes. She has been smoking about 0.25 packs per day. She has never used smokeless tobacco. She reports that she does not drink alcohol or use drugs.  Family History: denies hx Gyn cancers  Review of Systems: A full review of systems was performed and negative except as noted in the HPI.     Physical Exam:  Vital Signs: BP 126/72 (BP Location:  Left Arm)   Pulse 98   Temp 98.7 F (37.1 C) (Oral)   Resp 18   Ht 5\' 9"  (1.753 m)   Wt 89.4 kg   LMP 12/18/2016   SpO2 98%   BMI 29.09 kg/m  General: no acute distress.  HEENT: normocephalic, atraumatic Heart: regular rate & rhythm.  No murmurs/rubs/gallops Lungs: clear to auscultation bilaterally, normal respiratory effort Abdomen: soft, gravid, non-tender;  EFW: 7lbs Pelvic: SSE done: no evidence of HSV lesions, ulcerations or blisters.   External: Normal external female genitalia  Cervix: Dilation: 4 / Effacement (%): 50 / Station: -2    Extremities: non-tender, symmetric, no edema bilaterally.  DTRs: 2+  Neurologic: Alert & oriented x 3.    No results found for this or any previous visit (from the past 24 hour(s)).  Pertinent Results:  Prenatal Labs: Blood type/Rh  AB Pos  Antibody screen neg  Rubella Immune  Varicella  rec'd x 2 per [facility] recs  RPR NR  HBsAg Neg  HIV NR  GC neg  Chlamydia neg  1 hour GTT  141  3 hour GTT  68-114-91-43  GBS  8/23 negative   FHT: 150bpm, mod variability, + accels, no decels TOCO: Irreg UCs, q5-6115min SVE:  Dilation: 4 / Effacement (%): 50 / Station: -2    Cephalic by leopolds  No  results found.  Assessment:  Linda Hester is a 27 y.o. [id number] female at [redacted]w[redacted]d with early labor and hx rapid labors.   Plan:  1. Admit to Labor & Delivery; consents reviewed and obtained  2. Fetal Well being  - Fetal Tracing: Cat I tracing - Group B Streptococcus ppx indicated: negative - Presentation: cephalic confirmed by exam   3. Routine OB: - Prenatal labs reviewed, as above - Rh AB pos - CBC, T&S, RPR on admit - Clear fluids, saline lock  4. Monitoring of Labor:  -  Contractions: external toco in place -  Pelvis proven to 7lbs -  Plan for augmentation prn with pitocin or AROM.  -  Plan for continuous fetal monitoring  -  Maternal pain control as desired; discussed available options, pt has never had meds during labor before due  to rapid labors.  - Anticipate vaginal delivery  5. Post Partum Planning: - Infant feeding: formula - Contraception: Depo  McVey, REBECCA A, CNM 10/02/17 7:21 PM

## 2017-10-02 NOTE — OB Triage Note (Signed)
Pt presents c/o ctx that started last night and have steadily increased since she went to the doctor today around 2 or 3 and she states "I had a feeling like I had to do number 2 and the last time that happened a baby head was hanging out". Pt denies any bleeding or LOF. Reports positive fetal movement. VSS. Will continue to monitor.

## 2017-10-02 NOTE — Progress Notes (Signed)
Provider gave verbal order for RN to remove monitors so that patient could walk for an hour or two and then recheck her cervix to see if she has made any cervical change. Will continue to monitor

## 2017-10-03 ENCOUNTER — Encounter: Payer: Self-pay | Admitting: Obstetrics and Gynecology

## 2017-10-03 LAB — CBC
HCT: 27.8 % — ABNORMAL LOW (ref 35.0–47.0)
Hemoglobin: 9.4 g/dL — ABNORMAL LOW (ref 12.0–16.0)
MCH: 29.3 pg (ref 26.0–34.0)
MCHC: 33.7 g/dL (ref 32.0–36.0)
MCV: 87.1 fL (ref 80.0–100.0)
PLATELETS: 162 10*3/uL (ref 150–440)
RBC: 3.19 MIL/uL — ABNORMAL LOW (ref 3.80–5.20)
RDW: 15 % — AB (ref 11.5–14.5)
WBC: 10.1 10*3/uL (ref 3.6–11.0)

## 2017-10-03 MED ORDER — SENNOSIDES-DOCUSATE SODIUM 8.6-50 MG PO TABS
2.0000 | ORAL_TABLET | ORAL | Status: DC
  Filled 2017-10-03: qty 2

## 2017-10-03 MED ORDER — SIMETHICONE 80 MG PO CHEW: 80.0000 mg | CHEWABLE_TABLET | ORAL | Status: DC | PRN

## 2017-10-03 MED ORDER — SODIUM CHLORIDE 0.9 % IV SOLN
INTRAVENOUS | Status: DC | PRN
  Administered 2017-10-03 (×3): 5 mL via EPIDURAL

## 2017-10-03 MED ORDER — PHENYLEPHRINE 40 MCG/ML (10ML) SYRINGE FOR IV PUSH (FOR BLOOD PRESSURE SUPPORT)
80.0000 ug | PREFILLED_SYRINGE | INTRAVENOUS | Status: DC | PRN
  Filled 2017-10-03: qty 5

## 2017-10-03 MED ORDER — EPHEDRINE 5 MG/ML INJ
10.0000 mg | INTRAVENOUS | Status: DC | PRN
  Filled 2017-10-03: qty 2

## 2017-10-03 MED ORDER — WITCH HAZEL-GLYCERIN EX PADS: 1.0000 "application " | MEDICATED_PAD | CUTANEOUS | Status: DC | PRN

## 2017-10-03 MED ORDER — DIPHENHYDRAMINE HCL 25 MG PO CAPS: 25.0000 mg | ORAL_CAPSULE | Freq: Four times a day (QID) | ORAL | Status: DC | PRN

## 2017-10-03 MED ORDER — ONDANSETRON HCL 4 MG/2ML IJ SOLN: 4.0000 mg | INTRAMUSCULAR | Status: DC | PRN

## 2017-10-03 MED ORDER — MEDROXYPROGESTERONE ACETATE 150 MG/ML IM SUSP
150.0000 mg | INTRAMUSCULAR | Status: AC | PRN
  Administered 2017-10-04: 150 mg via INTRAMUSCULAR
  Filled 2017-10-03: qty 1

## 2017-10-03 MED ORDER — PRENATAL MULTIVITAMIN CH
1.0000 | ORAL_TABLET | Freq: Every day | ORAL | Status: DC
  Administered 2017-10-03 – 2017-10-04 (×2): 1 via ORAL
  Filled 2017-10-03 (×2): qty 1

## 2017-10-03 MED ORDER — ZOLPIDEM TARTRATE 5 MG PO TABS: 5.0000 mg | ORAL_TABLET | Freq: Every evening | ORAL | Status: DC | PRN

## 2017-10-03 MED ORDER — IBUPROFEN 600 MG PO TABS
600.0000 mg | ORAL_TABLET | Freq: Four times a day (QID) | ORAL | Status: DC
  Administered 2017-10-03 – 2017-10-04 (×4): 600 mg via ORAL
  Filled 2017-10-03 (×5): qty 1

## 2017-10-03 MED ORDER — LACTATED RINGERS IV SOLN: 500.0000 mL | Freq: Once | INTRAVENOUS | Status: AC

## 2017-10-03 MED ORDER — COCONUT OIL OIL: 1.0000 "application " | TOPICAL_OIL | Status: DC | PRN

## 2017-10-03 MED ORDER — ACETAMINOPHEN 325 MG PO TABS: 650.0000 mg | ORAL_TABLET | ORAL | Status: DC | PRN

## 2017-10-03 MED ORDER — DIBUCAINE 1 % RE OINT: 1.0000 "application " | TOPICAL_OINTMENT | RECTAL | Status: DC | PRN

## 2017-10-03 MED ORDER — OXYCODONE HCL 5 MG PO TABS: 10.0000 mg | ORAL_TABLET | ORAL | Status: DC | PRN

## 2017-10-03 MED ORDER — LIDOCAINE HCL (PF) 1 % IJ SOLN
INTRAMUSCULAR | Status: DC | PRN
  Administered 2017-10-02: 1 mL

## 2017-10-03 MED ORDER — ONDANSETRON HCL 4 MG PO TABS: 4.0000 mg | ORAL_TABLET | ORAL | Status: DC | PRN

## 2017-10-03 MED ORDER — FERROUS SULFATE 325 (65 FE) MG PO TABS
325.0000 mg | ORAL_TABLET | Freq: Two times a day (BID) | ORAL | Status: DC
  Administered 2017-10-03 – 2017-10-04 (×3): 325 mg via ORAL
  Filled 2017-10-03 (×3): qty 1

## 2017-10-03 MED ORDER — FENTANYL 2.5 MCG/ML W/ROPIVACAINE 0.15% IN NS 100 ML EPIDURAL (ARMC): 12.0000 mL/h | EPIDURAL | Status: DC

## 2017-10-03 MED ORDER — DIPHENHYDRAMINE HCL 50 MG/ML IJ SOLN: 12.5000 mg | INTRAMUSCULAR | Status: DC | PRN

## 2017-10-03 MED ORDER — FENTANYL 2.5 MCG/ML W/ROPIVACAINE 0.2% IN NS 100 ML EPIDURAL INFUSION (ARMC-ANES)
EPIDURAL | Status: DC | PRN
  Administered 2017-10-03: 12 mL/h via EPIDURAL

## 2017-10-03 MED ORDER — BENZOCAINE-MENTHOL 20-0.5 % EX AERO
1.0000 "application " | INHALATION_SPRAY | CUTANEOUS | Status: DC | PRN
  Filled 2017-10-03: qty 56

## 2017-10-03 MED ORDER — OXYCODONE HCL 5 MG PO TABS
5.0000 mg | ORAL_TABLET | ORAL | Status: DC | PRN
  Administered 2017-10-03 (×3): 5 mg via ORAL
  Filled 2017-10-03 (×3): qty 1

## 2017-10-03 NOTE — Progress Notes (Signed)
Labor Progress Note  Linda Hester is a 27 y.o. [id number] at 7252w4d by ultrasound admitted for early labor with hx HSV and rapid labors  Subjective: just received epidural, feeling shaky, still feeling UCs and has used PCA button per nursing.   Objective: BP (!) 109/50 (BP Location: Left Arm)   Pulse 72   Temp 97.9 F (36.6 C) (Oral)   Resp 18   Ht 5\' 9"  (1.753 m)   Wt 89.4 kg   LMP 12/18/2016   SpO2 100%   BMI 29.09 kg/m  Notable VS details: reviewed  Fetal Assessment: FHT:  FHR: 140 bpm, variability: moderate,  accelerations:  Present,  decelerations:  Present late decels, resolving after position changes and IV bolus, Pitocin Dc'd Category/reactivity:  Category II UC:   regular, every 1-2 minutes; Pitocin was at 8010mu/min and now DC with decels.  SVE:   6/C/-1, intact membranes noted.  Membrane status: AROM small amt clear fluid.   Labs: Lab Results  Component Value Date   WBC 10.9 10/02/2017   HGB 10.1 (L) 10/02/2017   HCT 29.4 (L) 10/02/2017   MCV 85.9 10/02/2017   PLT 206 10/02/2017    Assessment / Plan: Augmentation of labor, progressing well  Labor: Progressing normally, s/p Pitocin and AROM now.  Preeclampsia:  no e/o pre-e Fetal Wellbeing:  Category II Pain Control:  Epidural I/D:  n/a Anticipated MOD:  NSVD  McVey, REBECCA A, CNM 10/03/2017, 12:47 AM

## 2017-10-03 NOTE — Anesthesia Preprocedure Evaluation (Signed)
Anesthesia Evaluation  Patient identified by MRN, date of birth, ID band Patient awake    Reviewed: Allergy & Precautions, H&P , NPO status , Patient's Chart, lab work & pertinent test results  Airway Mallampati: III  TM Distance: >3 FB Neck ROM: full    Dental  (+) Teeth Intact Tongue ring:   Pulmonary asthma , Current Smoker,           Cardiovascular Exercise Tolerance: Good negative cardio ROS       Neuro/Psych    GI/Hepatic negative GI ROS,   Endo/Other    Renal/GU   negative genitourinary   Musculoskeletal   Abdominal   Peds  Hematology negative hematology ROS (+) anemia ,   Anesthesia Other Findings Past Medical History: No date: Anemia No date: Asthma  Past Surgical History: No date: NO PAST SURGERIES  BMI    Body Mass Index:  29.09 kg/m      Reproductive/Obstetrics (+) Pregnancy                             Anesthesia Physical Anesthesia Plan  ASA: II  Anesthesia Plan: Epidural   Post-op Pain Management:    Induction:   PONV Risk Score and Plan:   Airway Management Planned:   Additional Equipment:   Intra-op Plan:   Post-operative Plan:   Informed Consent: I have reviewed the patients History and Physical, chart, labs and discussed the procedure including the risks, benefits and alternatives for the proposed anesthesia with the patient or authorized representative who has indicated his/her understanding and acceptance.     Plan Discussed with: Anesthesiologist  Anesthesia Plan Comments:         Anesthesia Quick Evaluation

## 2017-10-03 NOTE — Discharge Summary (Signed)
Obstetrical Discharge Summary  Patient Name: Linda Hester DOB: 26-Mar-1990 MRN: [id number]  Date of Admission: 10/02/2017 Date of Delivery: 10/03/17 Delivered by: Heloise Ochoaebecca Shaneika Rossa CNM Date of Discharge: 10/04/2017  Primary OB: [facility]  JXB:[id number]'[id number]:Patient's last menstrual period was 12/18/2016.  EDC Estimated Date of Delivery: 10/06/17 Gestational Age at Delivery: 606w4d   Antepartum complications:  1. MJ use during pregnancy 2. Anemia  3. UTI 1st tri, strep mitis/oralis 4. Asthma, seasonal allergies 5. 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 Labor and delivery, indication for care 10/02/2017  . Pregnancy 09/07/2017  . Fetal growth restriction 02/21/2015  . NST (non-stress test) reactive on fetal surveillance 02/18/2015  . Uterine size date discrepancy 02/08/2015  . Uterine size date discrepancy pregnancy 02/08/2015    Augmentation: AROM and Pitocin Complications: None Intrapartum complications/course: admitted in early labor with cervical change after ambulation. Augmented with Pitocin, epidural given on request, then AROM. Cat II tracing intermittently with rapid progression to C/C/+1, SVD pf viable female   Date of Delivery: 10/03/17 Delivered By: Bonnell Public Derenda Giddings CNM Delivery Type: spontaneous vaginal delivery Anesthesia: epidural Placenta: spontaneous Laceration: right labial, no repair-hemostatic Episiotomy: none  Newborn Data: Live born female  Birth Weight: 7 lb 4.8 oz (3310 g) APGAR: 8, 9  Newborn Delivery   Birth date/time:  10/03/2017 02:45:00 Delivery type:  Vaginal, Spontaneous       Postpartum Procedures: none  Post partum course:  Patient had an uncomplicated postpartum course.  By time of discharge on PPD#1, her pain was controlled on oral pain medications; she had appropriate lochia and was ambulating, voiding without difficulty and  tolerating regular diet.  She was deemed stable for discharge to home.      Discharge Physical Exam:  BP 113/82 (BP Location: Right Arm)   Pulse 64   Temp 97.7 F (36.5 C) (Axillary)   Resp 18   Ht 5\' 9"  (1.753 m)   Wt 89.4 kg   LMP 12/18/2016   SpO2 100%   Breastfeeding? Unknown   BMI 29.09 kg/m   General: NAD CV: RRR Pulm: CTABL, nl effort ABD: s/nd/nt, fundus firm and below the umbilicus Lochia: small Perineum: well approximated/intact DVT Evaluation: LE non-ttp, no evidence of DVT on exam.  Hemoglobin  Date Value Ref Range Status  10/03/2017 9.4 (L) 12.0 - 16.0 g/dL Final   HCT  Date Value Ref Range Status  10/03/2017 27.8 (L) 35.0 - 47.0 % Final     Disposition: stable, discharge to home. Baby Feeding: formula Baby Disposition: home with mom  Rh Immune globulin given: n/a Rubella vaccine given: immune Varicella vaccine given: immune Tdap vaccine given in AP setting: 07/13/17 Flu vaccine given in AP or PP setting: n/a  Contraception: Depo  Prenatal Labs:  Blood type/Rh  AB Pos  Antibody screen neg  Rubella Immune  Varicella  rec'd x 2 per [facility] recs  RPR NR  HBsAg Neg  HIV NR  GC neg  Chlamydia neg  1 hour GTT  141  3 hour GTT  68-114-91-43  GBS  8/23 negative     Plan:  Tamatha Escutia was discharged to home in good condition. Follow-up appointment with delivering provider in 6 weeks.  Discharge Medications: Allergies as of 10/04/2017      Reactions   Shellfish Allergy Anaphylaxis      Medication List    TAKE these medications  acetaminophen 325 MG tablet Commonly known as:  TYLENOL Take 2 tablets (650 mg total) by mouth every 4 (four) hours as needed (for pain scale < 4).   albuterol 108 (90 Base) MCG/ACT inhaler Commonly known as:  PROVENTIL HFA;VENTOLIN HFA Inhale 2 puffs into the lungs every 6 (six) hours as needed for wheezing or shortness of breath.   ferrous sulfate 325 (65 FE) MG tablet Take 1 tablet (325 mg total) by mouth  2 (two) times daily with a meal. What changed:  when to take this   guaiFENesin 200 MG tablet Take 2 tablets (400 mg total) by mouth every 4 (four) hours as needed for cough or to loosen phlegm.   ibuprofen 600 MG tablet Commonly known as:  ADVIL,MOTRIN Take 1 tablet (600 mg total) by mouth every 6 (six) hours.   medroxyPROGESTERone 150 MG/ML injection Commonly known as:  DEPO-PROVERA Inject 1 mL (150 mg total) into the muscle every 3 (three) months. Next dose due 12/1-15   valACYclovir 1000 MG tablet Commonly known as:  VALTREX Take 1 tablet (1,000 mg total) by mouth 2 (two) times daily for 3 days. For outbreak as needed What changed:  additional instructions       Follow-up Information    Ramona Slinger, Prudencio Pair, CNM. Schedule an appointment as soon as possible for a visit in 6 week(s).   Specialty:  Obstetrics and Gynecology Contact information: [address] [place] [address] [phone]           Signed:  Randa Ngo, CNM 10/04/2017  11:21 AM

## 2017-10-03 NOTE — Progress Notes (Signed)
Epidural infusion stopped at 0305. Catheter removed at 0505. Cathter intact at removal. Site clean, dry and intact.

## 2017-10-03 NOTE — Plan of Care (Signed)
Afeb. VSS Color good, skin w&d. Alert and oriented with aprop. Affect. Fundus is firm at U/-2 with scant Lochia. Ambulating independently without c/o. Has voided without difficulty. Appears to be bonding well with Infant.

## 2017-10-03 NOTE — Anesthesia Procedure Notes (Signed)
Epidural Patient location during procedure: OB Start time: 10/03/2017 11:38 PM End time: 10/03/2017 12:15 AM  Staffing Anesthesiologist: Jovita GammaFitzgerald, Keshara Kiger L, MD Performed: anesthesiologist   Preanesthetic Checklist Completed: patient identified, site marked, surgical consent, pre-op evaluation, timeout performed, IV checked, risks and benefits discussed and monitors and equipment checked  Epidural Patient position: sitting Prep: Betadine Patient monitoring: heart rate, continuous pulse ox and blood pressure Approach: midline Location: L3-L4 Injection technique: LOR saline  Needle:  Needle type: Tuohy  Needle gauge: 18 G Needle length: 9 cm and 9 Needle insertion depth: 6 cm Catheter type: closed end flexible Catheter size: 20 Guage Catheter at skin depth: 10 cm Test dose: negative and Other  Assessment Events: blood not aspirated, injection not painful, no injection resistance, negative IV test and no paresthesia  Additional Notes   Patient tolerated the insertion well without complications.Reason for block:procedure for pain

## 2017-10-04 LAB — RPR: RPR Ser Ql: NONREACTIVE

## 2017-10-04 MED ORDER — VALACYCLOVIR HCL 1 G PO TABS: 1000.0000 mg | ORAL_TABLET | Freq: Two times a day (BID) | ORAL | 2 refills | Status: AC

## 2017-10-04 MED ORDER — MEDROXYPROGESTERONE ACETATE 150 MG/ML IM SUSP: 150.0000 mg | INTRAMUSCULAR | 2 refills | Status: DC

## 2017-10-04 MED ORDER — ACETAMINOPHEN 325 MG PO TABS: 650.0000 mg | ORAL_TABLET | ORAL | Status: DC | PRN

## 2017-10-04 MED ORDER — IBUPROFEN 600 MG PO TABS: 600.0000 mg | ORAL_TABLET | Freq: Four times a day (QID) | ORAL | 0 refills | Status: DC

## 2017-10-04 MED ORDER — FERROUS SULFATE 325 (65 FE) MG PO TABS: 325.0000 mg | ORAL_TABLET | Freq: Two times a day (BID) | ORAL | 3 refills | Status: DC

## 2017-10-04 NOTE — Discharge Instructions (Signed)
Call your doctor for increased pain or vaginal bleeding, temperature above 100.4, depression, or concerns.  Continue prenatal vitamin and iron.  No strenuous activity or heavy lifting for 6 weeks.  No intercourse, tampons, douching, or enemas for 6 weeks.  No tub baths- showers only.  No driving for 2 weeks or while taking pain medications.

## 2017-10-04 NOTE — Progress Notes (Signed)
Post Partum Day 1 Subjective: Doing well, no complaints. Desires Dc home today.  Tolerating regular diet, pain with PO meds, voiding and ambulating without difficulty.  No CP SOB Fever,Chills, N/V or leg pain; denies nipple or breast pain; no HA change of vision, RUQ/epigastric pain  Objective: BP 113/82 (BP Location: Right Arm)   Pulse 64   Temp 97.7 F (36.5 C) (Axillary)   Resp 18   Ht 5\' 9"  (1.753 m)   Wt 89.4 kg   LMP 12/18/2016   SpO2 100%   Breastfeeding? Unknown   BMI 29.09 kg/m    Physical Exam:  General: NAD Breasts: soft/nontender CV: RRR Pulm: nl effort, CTABL Abdomen: soft, NT, BS x 4 Perineum: minimal edema, intact Lochia: small Uterine Fundus: fundus firm and 2 fb below umbilicus DVT Evaluation: no cords, ttp LEs   Recent Labs    10/02/17 2022 10/03/17 0858  HGB 10.1* 9.4*  HCT 29.4* 27.8*  WBC 10.9 10.1  PLT 206 162    Assessment/Plan: 27 y.o. [id number] postpartum day # 1  - Continue routine PP care - encouraged snug fitting bra and cabbage leaves for bottlefeeding.  - Desires Depo prior to DC - Acute blood loss anemia - hemodynamically stable and asymptomatic; continue po ferrous sulfate BID with stool softeners  - Immunization status:  all Imms up to date    Disposition: Does desire Dc home today.    McVey, REBECCA A, CNM 10/04/2017  11:13 AM

## 2017-10-04 NOTE — Progress Notes (Signed)
Education provided on need for influenza vaccine.  Pt declines vaccine at this time. Pt states that she has already received TDaP vaccine during pregnancy. Reynold BowenSusan Paisley Devonna Oboyle, RN 10/04/2017 3:18 PM

## 2017-10-04 NOTE — Progress Notes (Signed)
Discharge instructions provided.  Pt verbalizes understanding of all instructions and follow-up care.  Pt discharged to home with infant at 1440 on 10/04/17 via wheelchair by NT. Reynold BowenSusan Paisley Latavius Capizzi, RN 10/04/2017 3:11 PM

## 2017-10-27 LAB — HM HIV SCREENING LAB: HM HIV Screening: NEGATIVE

## 2018-01-29 ENCOUNTER — Other Ambulatory Visit: Payer: Self-pay

## 2018-01-29 ENCOUNTER — Encounter: Payer: Self-pay | Admitting: Emergency Medicine

## 2018-01-29 ENCOUNTER — Emergency Department
Admission: EM | Admit: 2018-01-29 | Discharge: 2018-01-29 | Disposition: A | Discharge status: Home / Self Care | Payer: Medicaid Other | Attending: Emergency Medicine | Admitting: Emergency Medicine

## 2018-01-29 DIAGNOSIS — Z79899 Other long term (current) drug therapy: Secondary | ICD-10-CM | POA: Insufficient documentation

## 2018-01-29 DIAGNOSIS — J45909 Unspecified asthma, uncomplicated: Secondary | ICD-10-CM | POA: Insufficient documentation

## 2018-01-29 DIAGNOSIS — J101 Influenza due to other identified influenza virus with other respiratory manifestations: Secondary | ICD-10-CM

## 2018-01-29 DIAGNOSIS — F17210 Nicotine dependence, cigarettes, uncomplicated: Secondary | ICD-10-CM | POA: Insufficient documentation

## 2018-01-29 DIAGNOSIS — R05 Cough: Secondary | ICD-10-CM | POA: Diagnosis present

## 2018-01-29 LAB — INFLUENZA PANEL BY PCR (TYPE A & B)
INFLBPCR: POSITIVE — AB
Influenza A By PCR: NEGATIVE

## 2018-01-29 MED ORDER — OSELTAMIVIR PHOSPHATE 75 MG PO CAPS: 75.0000 mg | ORAL_CAPSULE | Freq: Two times a day (BID) | ORAL | 0 refills | Status: AC

## 2018-01-29 MED ORDER — PSEUDOEPH-BROMPHEN-DM 30-2-10 MG/5ML PO SYRP: 5.0000 mL | ORAL_SOLUTION | Freq: Four times a day (QID) | ORAL | 0 refills | Status: DC | PRN

## 2018-01-29 MED ORDER — IBUPROFEN 600 MG PO TABS: 600.0000 mg | ORAL_TABLET | Freq: Four times a day (QID) | ORAL | 0 refills | Status: DC | PRN

## 2018-01-29 MED ORDER — ACETAMINOPHEN 500 MG PO TABS: 500.0000 mg | ORAL_TABLET | Freq: Four times a day (QID) | ORAL | 0 refills | Status: DC | PRN

## 2018-01-29 NOTE — ED Triage Notes (Signed)
Cough, scratchy throat buring in chest, productive cough, head hurts.  Since yesterday.

## 2018-01-29 NOTE — ED Provider Notes (Signed)
[facility] Emergency Department Provider Note  ____________________________________________  Time seen: Approximately 12:42 PM  I have reviewed the triage vital signs and the nursing notes.   HISTORY  Chief Complaint Cough    HPI Linda Hester is a 28 y.o. female presents emergency department for evaluation of low-grade fever, headache, chills, body aches, scratchy throat, nonproductive cough since yesterday.  Patient states that everything hurts, even her eyes.  Patient has a history of asthma and bronchitis.  Patient smokes 2 to 3 cigarettes/day.  No nasal congestion, shortness of breath, vomiting, diarrhea.   Past Medical History:  Diagnosis Date  . Anemia   . Asthma     Patient Active Problem List   Diagnosis Date Noted  . Labor and delivery, indication for care 10/02/2017  . Pregnancy 09/07/2017  . Fetal growth restriction 02/21/2015  . NST (non-stress test) reactive on fetal surveillance 02/18/2015  . Uterine size date discrepancy 02/08/2015  . Uterine size date discrepancy pregnancy 02/08/2015    Past Surgical History:  Procedure Laterality Date  . NO PAST SURGERIES      Prior to Admission medications   Medication Sig Start Date End Date Taking? Authorizing Provider  acetaminophen (TYLENOL) 500 MG tablet Take 1 tablet (500 mg total) by mouth every 6 (six) hours as needed. 01/29/18   Enid DerryWagner, Mackayla Mullins, PA-C  albuterol (PROVENTIL HFA;VENTOLIN HFA) 108 (90 Base) MCG/ACT inhaler Inhale 2 puffs into the lungs every 6 (six) hours as needed for wheezing or shortness of breath.    [provider]  brompheniramine-pseudoephedrine-DM 30-2-10 MG/5ML syrup Take 5 mLs by mouth 4 (four) times daily as needed. 01/29/18   Enid DerryWagner, Twilia Yaklin, PA-C  ferrous sulfate 325 (65 FE) MG tablet Take 1 tablet (325 mg total) by mouth 2 (two) times daily with a meal. 10/04/17   McVey, Prudencio Pairebecca A, CNM  guaiFENesin 200 MG tablet Take 2 tablets (400 mg total) by mouth every  4 (four) hours as needed for cough or to loosen phlegm. Patient not taking: Reported on 08/01/2017 03/16/17   Kem Boroughsriplett, Cari B, FNP  ibuprofen (ADVIL,MOTRIN) 600 MG tablet Take 1 tablet (600 mg total) by mouth every 6 (six) hours as needed. 01/29/18   Enid DerryWagner, Dimples Probus, PA-C  medroxyPROGESTERone (DEPO-PROVERA) 150 MG/ML injection Inject 1 mL (150 mg total) into the muscle every 3 (three) months. Next dose due 12/1-15 10/04/17   McVey, Prudencio Pairebecca A, CNM  oseltamivir (TAMIFLU) 75 MG capsule Take 1 capsule (75 mg total) by mouth 2 (two) times daily for 5 days. 01/29/18 02/03/18  Enid DerryWagner, Angelys Yetman, PA-C    Allergies Shellfish allergy  No family history on file.  Social History Social History   Tobacco Use  . Smoking status: Current Some Day Smoker    Packs/day: 0.25    Types: Cigarettes  . Smokeless tobacco: Never Used  . Tobacco comment: "every now and then"   Substance Use Topics  . Alcohol use: No  . Drug use: No    Comment: had + UDS 01-09-15 for MJ     Review of Systems  Constitutional: Positive for chills. ENT: No upper respiratory complaints. Cardiovascular: No chest pain. Respiratory: No cough. No SOB. Gastrointestinal: No abdominal pain.  No nausea, no vomiting.  Musculoskeletal: Positive for body aches. Skin: Negative for rash, abrasions, lacerations, ecchymosis. Neurological: Positive for headache.   ____________________________________________   PHYSICAL EXAM:  VITAL SIGNS: ED Triage Vitals  Enc Vitals Group     BP 01/29/18 1200 117/85     Pulse --  Resp --      Temp 01/29/18 1200 99.5 F (37.5 C)     Temp Source 01/29/18 1200 Oral     SpO2 01/29/18 1200 98 %     Weight 01/29/18 1201 183 lb (83 kg)     Height 01/29/18 1201 5\' 9"  (1.753 m)     Head Circumference --      Peak Flow --      Pain Score 01/29/18 1217 8     Pain Loc --      Pain Edu? --      Excl. in GC? --      Constitutional: Alert and oriented. Well appearing and in no acute distress. Eyes:  Conjunctivae are normal. PERRL. EOMI. Head: Atraumatic. ENT:      Ears:      Nose: No congestion/rhinnorhea.      Mouth/Throat: Mucous membranes are moist.  Neck: No stridor. Cardiovascular: Normal rate, regular rhythm.  Good peripheral circulation. Respiratory: Normal respiratory effort without tachypnea or retractions. Lungs CTAB. Good air entry to the bases with no decreased or absent breath sounds. Gastrointestinal: Bowel sounds 4 quadrants. Soft and nontender to palpation. No guarding or rigidity. No palpable masses. No distention.  Musculoskeletal: Full range of motion to all extremities. No gross deformities appreciated. Neurologic:  Normal speech and language. No gross focal neurologic deficits are appreciated.  Skin:  Skin is warm, dry and intact. No rash noted. Psychiatric: Mood and affect are normal. Speech and behavior are normal. Patient exhibits appropriate insight and judgement.   ____________________________________________   LABS (all labs ordered are listed, but only abnormal results are displayed)  Labs Reviewed  INFLUENZA PANEL BY PCR (TYPE A & B) - Abnormal; Notable for the following components:      Result Value   Influenza B By PCR POSITIVE (*)    All other components within normal limits   ____________________________________________  EKG   ____________________________________________  RADIOLOGY   No results found.  ____________________________________________    PROCEDURES  Procedure(s) performed:    Procedures    Medications - No data to display   ____________________________________________   INITIAL IMPRESSION / ASSESSMENT AND PLAN / ED COURSE  Pertinent labs & imaging results that were available during my care of the patient were reviewed by me and considered in my medical decision making (see chart for details).  Review of the [place] CSRS was performed in accordance of the [facility] prior to dispensing any controlled  drugs.   Patient's diagnosis is consistent with influenza B.  Vital signs and exam are reassuring.  Influenza test is positive.  Patient will be discharged home with prescriptions for Tamiflu, ibuprofen, bromfed. Patient is to follow up with primary care as directed. Patient is given ED precautions to return to the ED for any worsening or new symptoms.     ____________________________________________  FINAL CLINICAL IMPRESSION(S) / ED DIAGNOSES  Final diagnoses:  Influenza B      NEW MEDICATIONS STARTED DURING THIS VISIT:  ED Discharge Orders         Ordered    oseltamivir (TAMIFLU) 75 MG capsule  2 times daily     01/29/18 1418    brompheniramine-pseudoephedrine-DM 30-2-10 MG/5ML syrup  4 times daily PRN     01/29/18 1418    acetaminophen (TYLENOL) 500 MG tablet  Every 6 hours PRN     01/29/18 1418    ibuprofen (ADVIL,MOTRIN) 600 MG tablet  Every 6 hours PRN     01/29/18 1418  This chart was dictated using voice recognition software/Dragon. Despite best efforts to proofread, errors can occur which can change the meaning. Any change was purely unintentional.    Enid DerryWagner, Valory Wetherby, PA-C 01/29/18 1508    Don PerkingVeronese, [place], MD 01/31/18 [phone]

## 2018-01-30 IMAGING — US US OB TRANSVAGINAL
1 series · 13 of 28 positions shown · non-contrast
Comparison: Pelvic ultrasound performed 02/14/2015

CLINICAL DATA: Acute onset of vaginal bleeding and pelvic cramping.
Initial encounter.

EXAM:
OBSTETRIC <14 WK US AND TRANSVAGINAL OB US
TECHNIQUE: Both transabdominal and transvaginal ultrasound examinations were
performed for complete evaluation of the gestation as well as the
maternal uterus, adnexal regions, and pelvic cul-de-sac.
Transvaginal technique was performed to assess early pregnancy.

[Series 1: us ob transvaginal · 0.23mm/px · 13 of 90 slices shown]
[im 4/90]
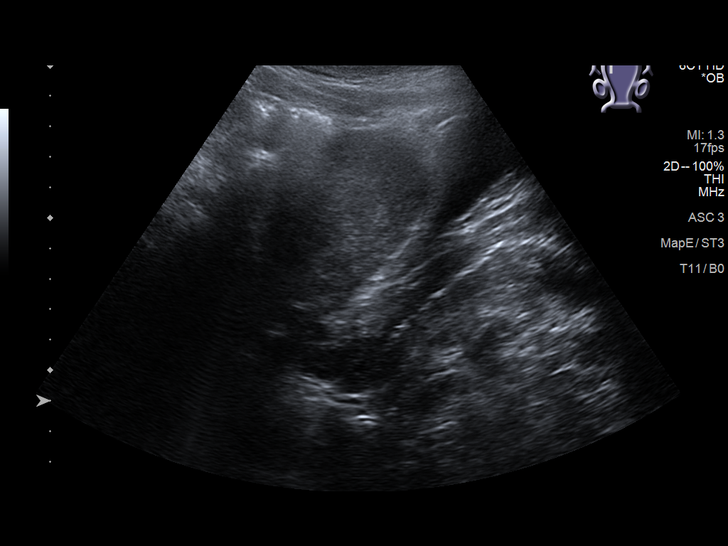
[im 10/90]
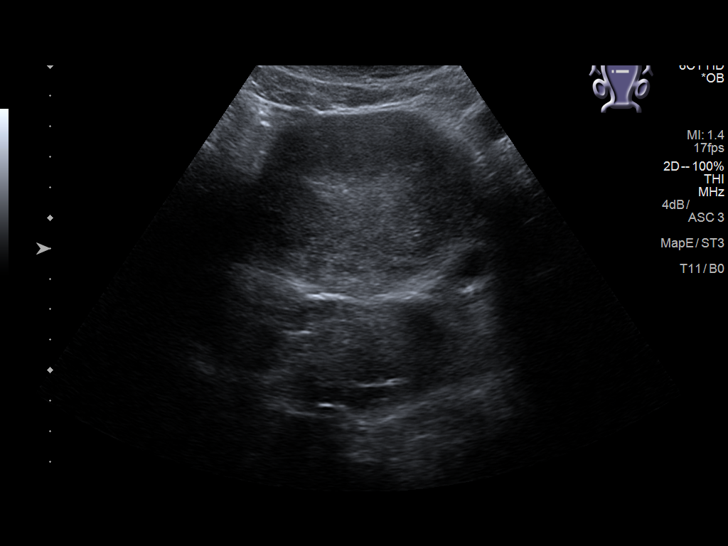
[im 17/90]
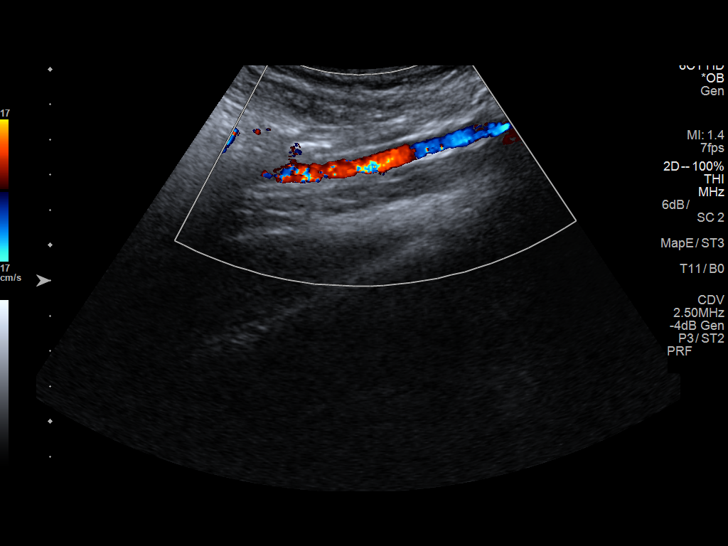
[im 24/90]
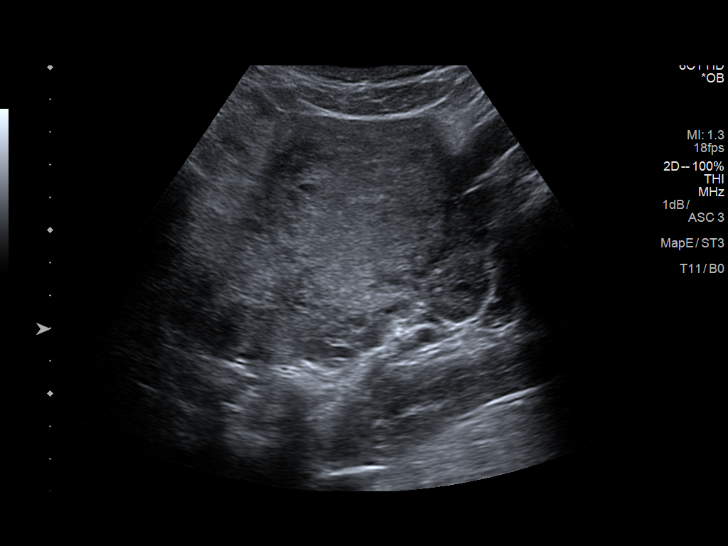
[im 30/90]
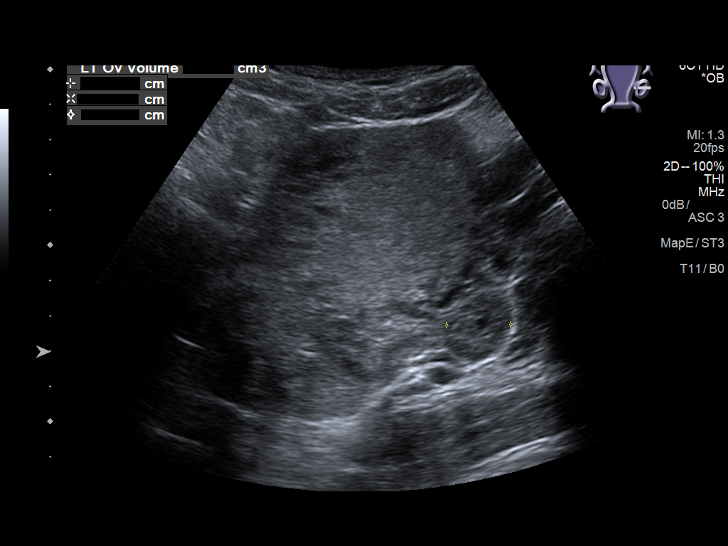
[im 37/90]
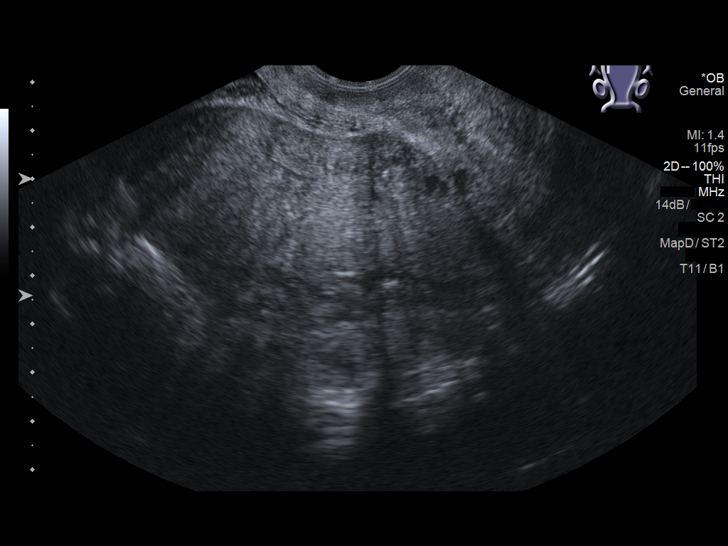
[im 47/90]
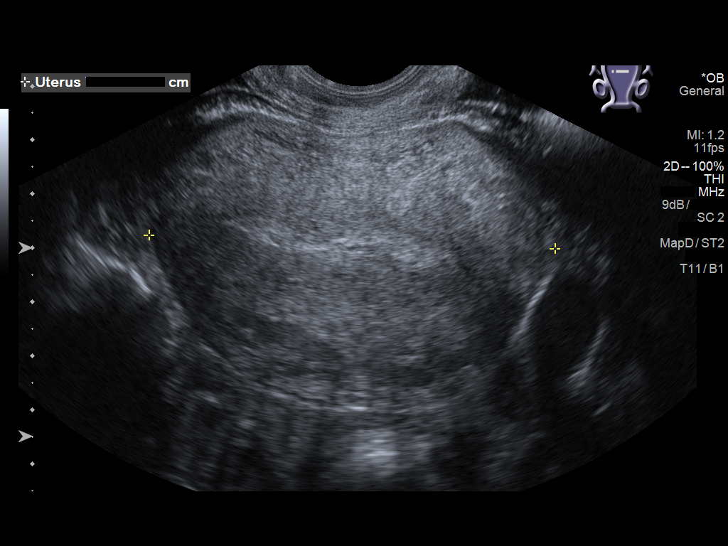
[im 53/90]
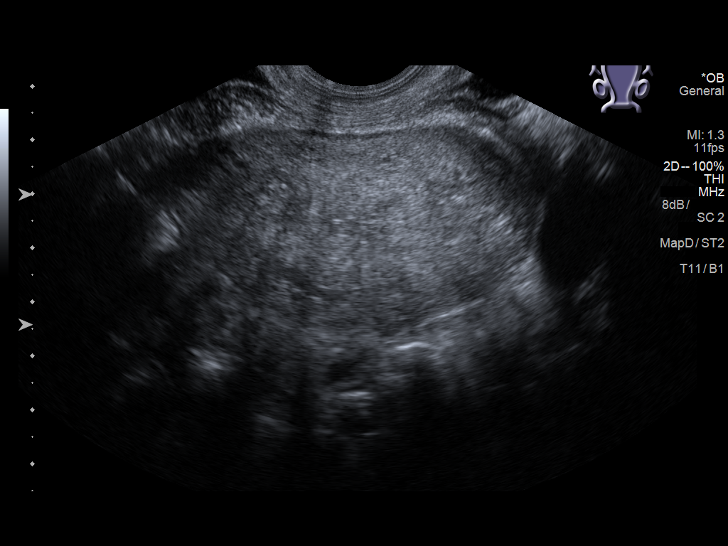
[im 60/90]
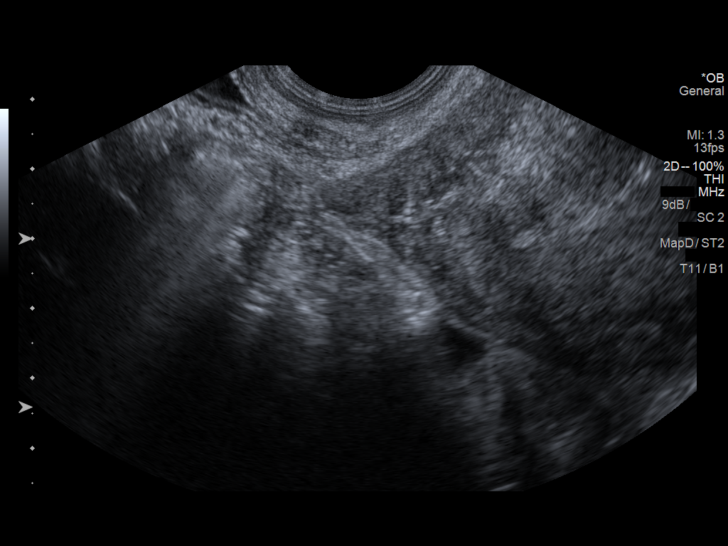
[im 66/90]
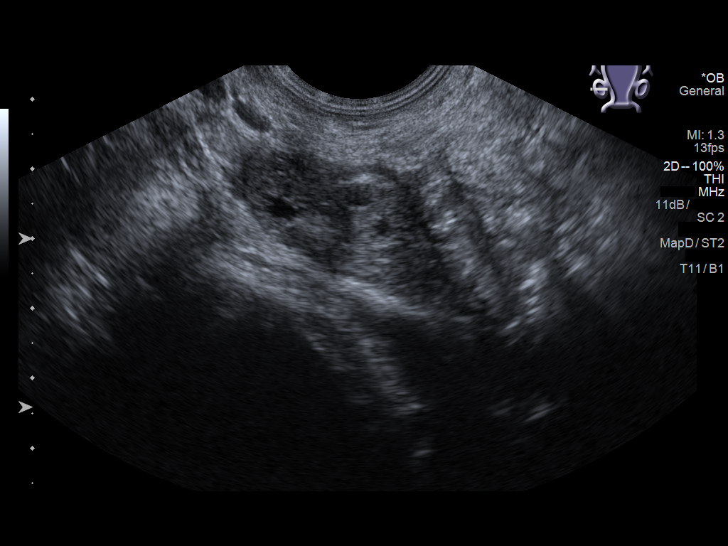
[im 73/90]
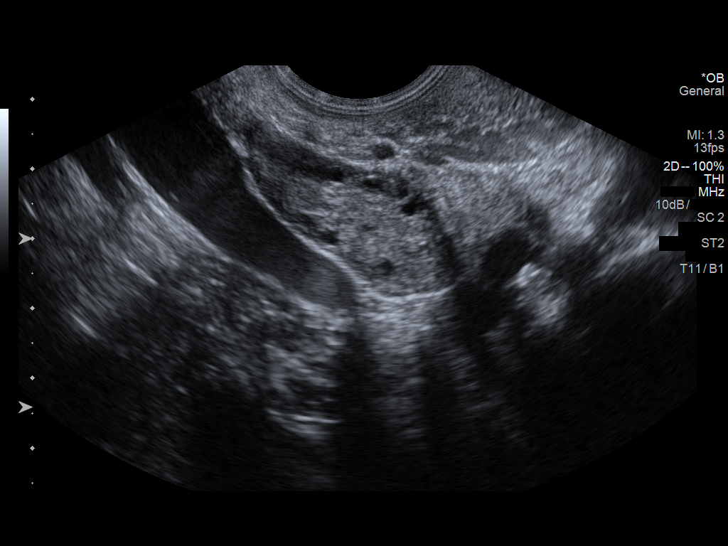
[im 80/90]
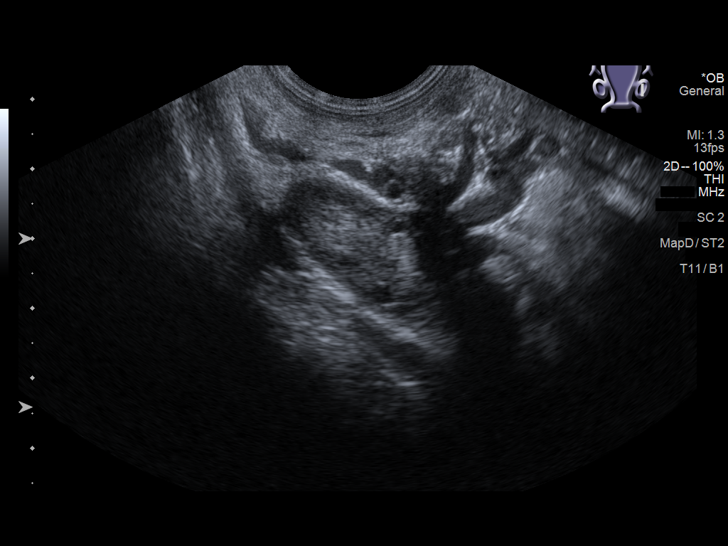
[im 86/90]
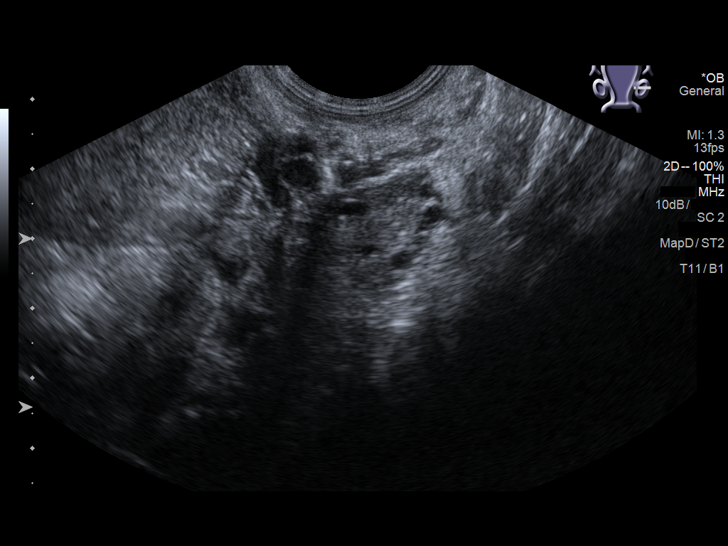

[13 of 28 positions shown; findings below may reference images not displayed]

FINDINGS: Intrauterine gestational sac: None seen.

Yolk sac:  N/A

Embryo:  N/A

Subchorionic hemorrhage:  None visualized.

Maternal uterus/adnexae: Trace heterogeneous material is noted
within the endometrial canal, likely reflecting clot.

The ovaries are within normal limits. The right ovary measures 4.5 x
1.9 x 3.5 cm, while the left ovary measures 3.3 x 1.7 x 1.6 cm. No
suspicious adnexal masses are seen; there is no evidence for ovarian
torsion.

Trace free fluid is seen within the pelvic cul-de-sac.
IMPRESSION: No intrauterine gestational sac pregnancy seen. No evidence for
ectopic pregnancy. Findings are compatible with recent spontaneous
abortion. Minimal residual clot noted within the endometrial canal.
Ovaries are unremarkable in appearance.

## 2018-02-26 DIAGNOSIS — A6000 Herpesviral infection of urogenital system, unspecified: Secondary | ICD-10-CM

## 2018-07-01 IMAGING — US US OB TRANSVAGINAL
1 series · 13 of 28 positions shown · non-contrast
Comparison: None.

CLINICAL DATA: Initial evaluation for acute vaginal bleeding, early
pregnancy.

EXAM:
OBSTETRIC <14 WK US AND TRANSVAGINAL OB US
TECHNIQUE: Both transabdominal and transvaginal ultrasound examinations were
performed for complete evaluation of the gestation as well as the
maternal uterus, adnexal regions, and pelvic cul-de-sac.
Transvaginal technique was performed to assess early pregnancy.

[Series 1: us ob transvaginal · 0.23mm/px · 13 of 109 slices shown]
[im 5/109]
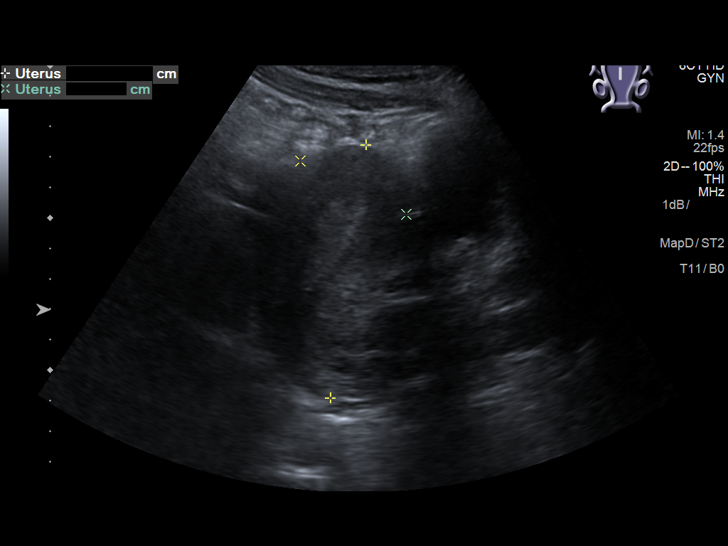
[im 13/109]
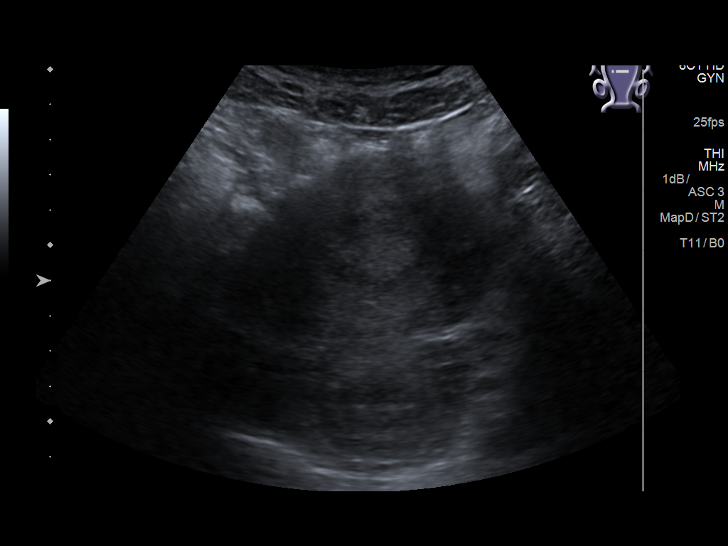
[im 21/109]
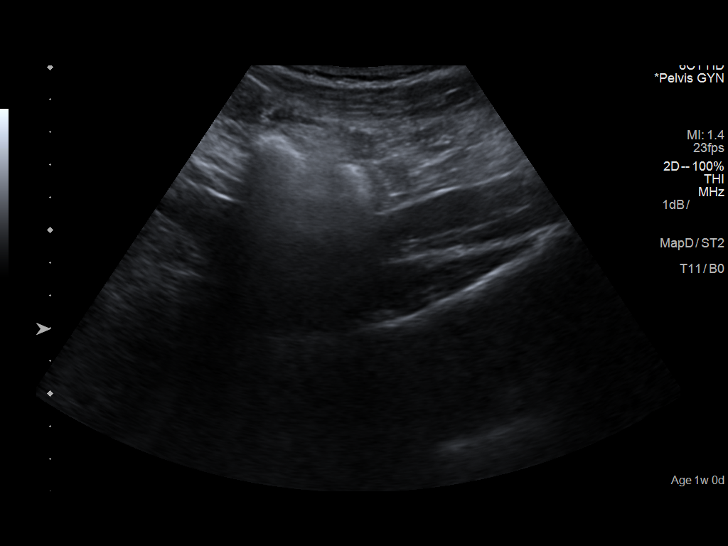
[im 29/109]
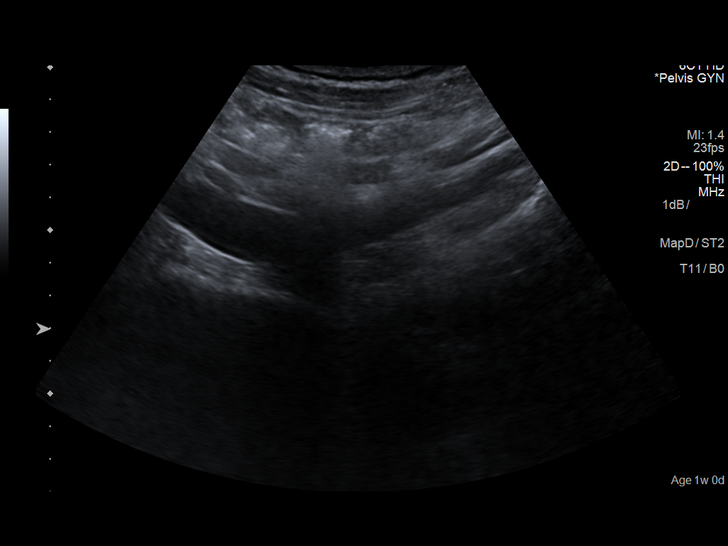
[im 37/109]
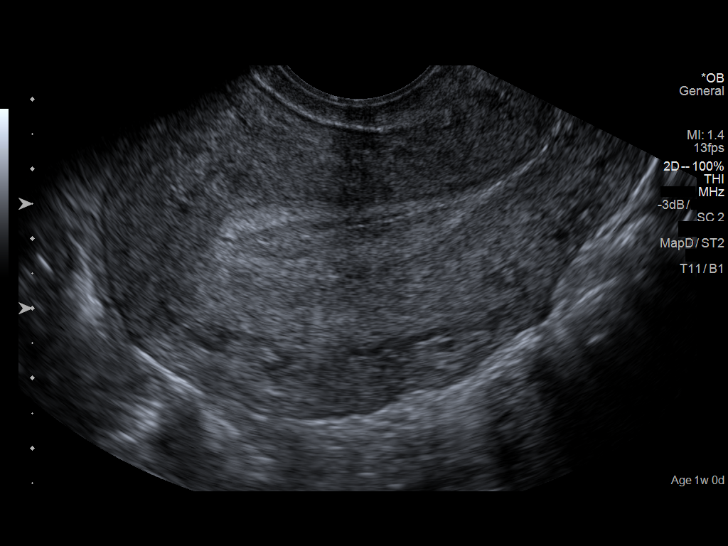
[im 45/109]
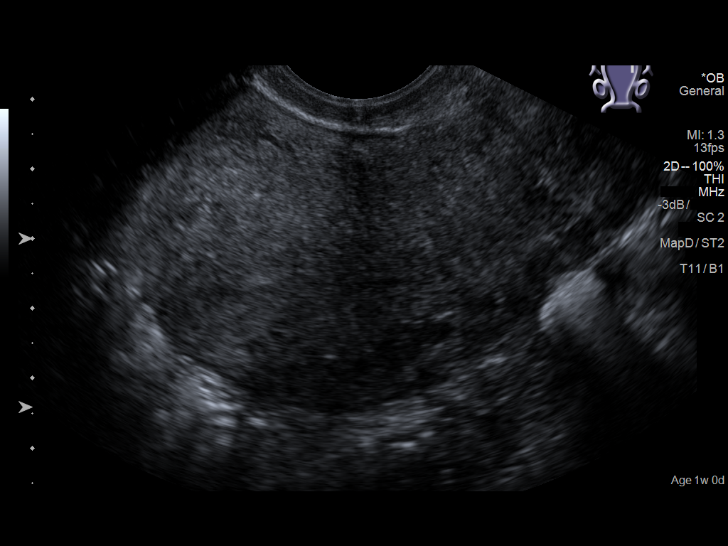
[im 57/109]
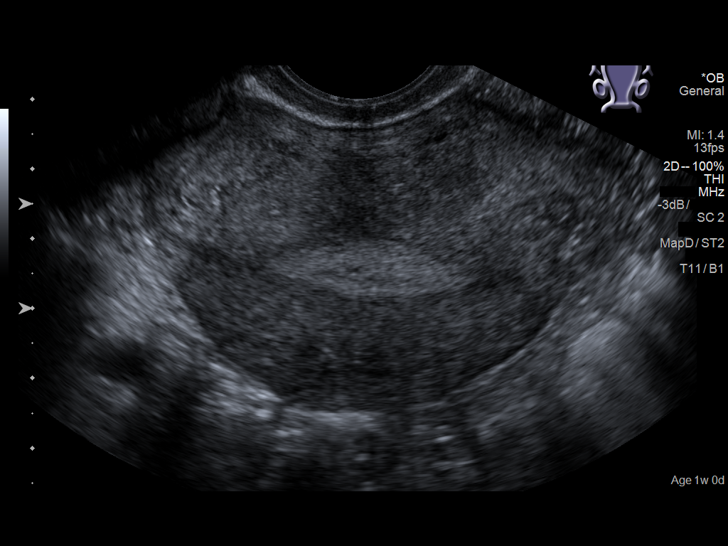
[im 65/109]
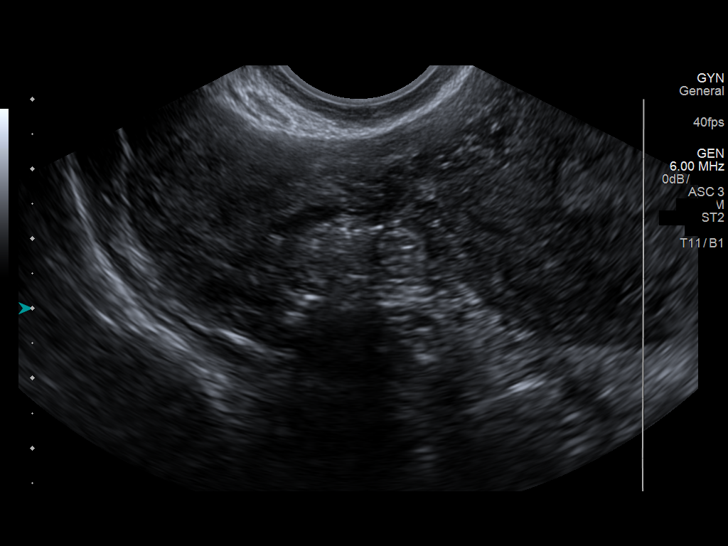
[im 73/109]
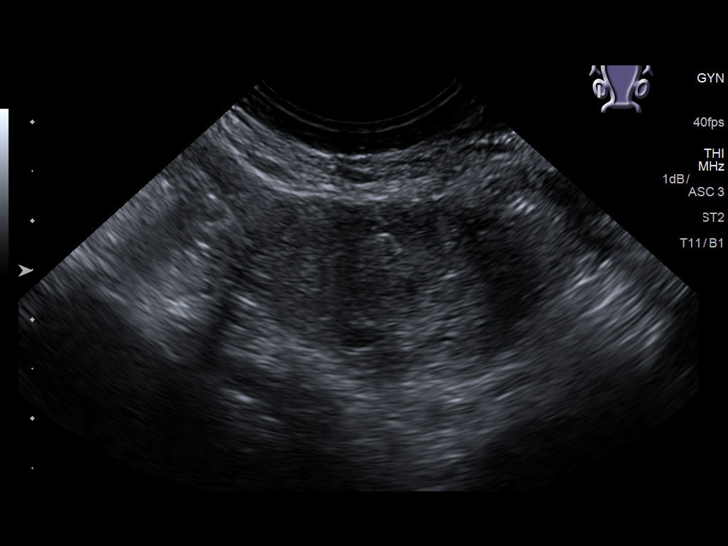
[im 81/109]
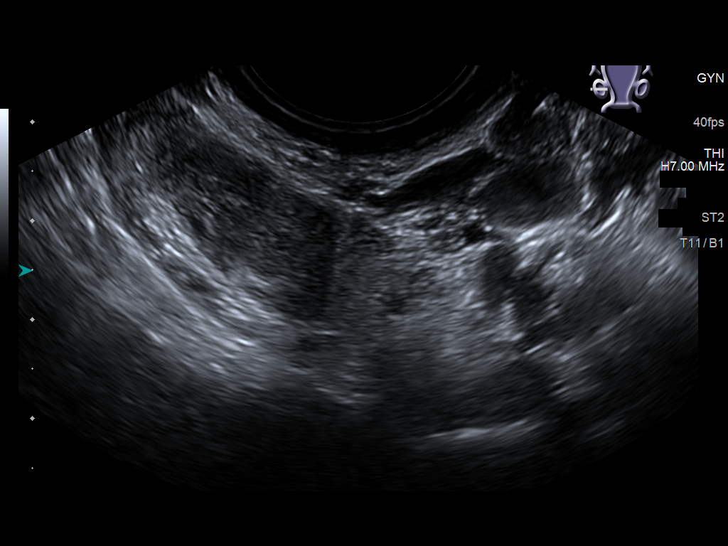
[im 89/109]
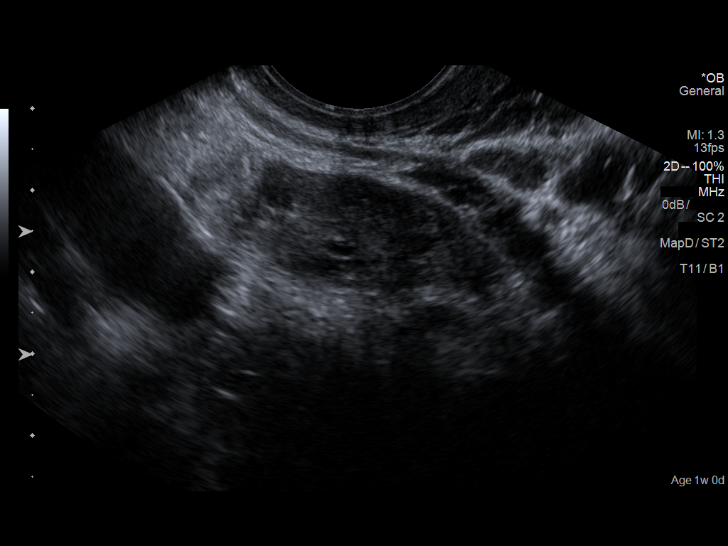
[im 97/109]
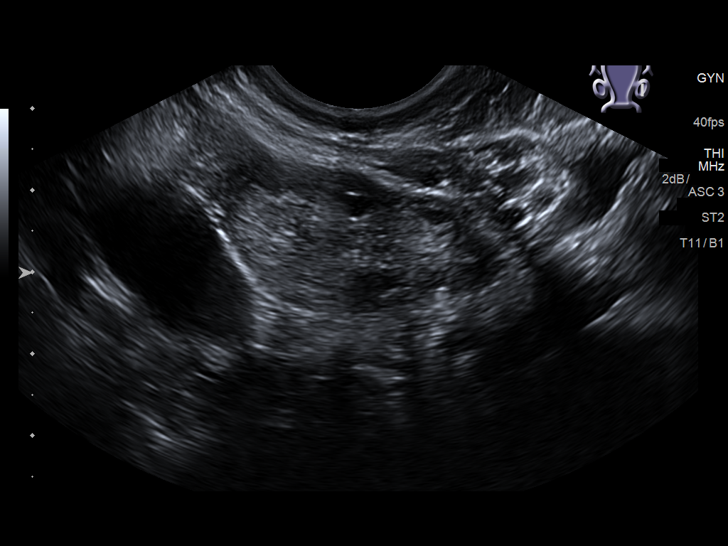
[im 105/109]
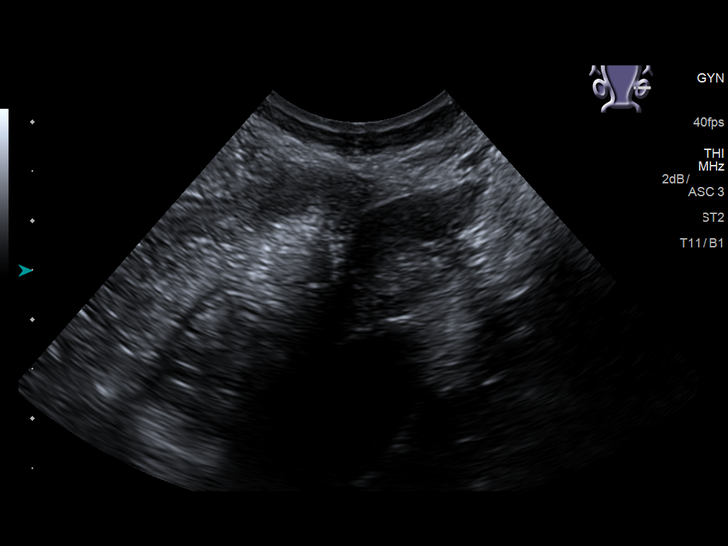

[13 of 28 positions shown; findings below may reference images not displayed]

FINDINGS: Intrauterine gestational sac: None visualized.

Yolk sac:  Negative

Embryo:  Negative

Cardiac Activity: N/A

Heart Rate: N/A  bpm

Subchorionic hemorrhage:  None visualized.

Maternal uterus/adnexae: Left ovary normal in appearance. Probable
small corpus luteal cyst noted within the right ovary. No adnexal
mass. No free fluid.
IMPRESSION: 1. Early pregnancy with no discrete IUP or adnexal mass identified.
Finding is consistent with a pregnancy of unknown anatomic location.
Differential considerations include IUP to early to visualize,
recent SAB, or possibly occult ectopic pregnancy. Close clinical
monitoring with serial beta HCGs and close interval follow-up
ultrasound recommended as clinically warranted.
2. No other acute maternal uterine or adnexal abnormality
identified.

## 2018-07-28 ENCOUNTER — Encounter: Payer: Self-pay | Admitting: Emergency Medicine

## 2018-07-28 ENCOUNTER — Other Ambulatory Visit: Payer: Self-pay

## 2018-07-28 ENCOUNTER — Emergency Department
Admission: EM | Admit: 2018-07-28 | Discharge: 2018-07-28 | Disposition: A | Discharge status: Home / Self Care | Payer: Medicaid Other | Attending: Student in an Organized Health Care Education/Training Program | Admitting: Student in an Organized Health Care Education/Training Program

## 2018-07-28 DIAGNOSIS — N760 Acute vaginitis: Secondary | ICD-10-CM | POA: Insufficient documentation

## 2018-07-28 DIAGNOSIS — B9689 Other specified bacterial agents as the cause of diseases classified elsewhere: Secondary | ICD-10-CM | POA: Insufficient documentation

## 2018-07-28 DIAGNOSIS — J45909 Unspecified asthma, uncomplicated: Secondary | ICD-10-CM | POA: Insufficient documentation

## 2018-07-28 DIAGNOSIS — F17210 Nicotine dependence, cigarettes, uncomplicated: Secondary | ICD-10-CM | POA: Insufficient documentation

## 2018-07-28 DIAGNOSIS — Z79899 Other long term (current) drug therapy: Secondary | ICD-10-CM | POA: Insufficient documentation

## 2018-07-28 LAB — URINALYSIS, COMPLETE (UACMP) WITH MICROSCOPIC
Bacteria, UA: NONE SEEN
Bilirubin Urine: NEGATIVE
Glucose, UA: NEGATIVE mg/dL
Hgb urine dipstick: NEGATIVE
Ketones, ur: NEGATIVE mg/dL
Leukocytes,Ua: NEGATIVE
Nitrite: NEGATIVE
Protein, ur: 30 mg/dL — AB
Specific Gravity, Urine: 1.026 (ref 1.005–1.030)
pH: 5 (ref 5.0–8.0)

## 2018-07-28 LAB — LIPASE, BLOOD: Lipase: 29 U/L (ref 11–51)

## 2018-07-28 LAB — COMPREHENSIVE METABOLIC PANEL
ALT: 13 U/L (ref 0–44)
AST: 15 U/L (ref 15–41)
Albumin: 4.3 g/dL (ref 3.5–5.0)
Alkaline Phosphatase: 64 U/L (ref 38–126)
Anion gap: 7 (ref 5–15)
BUN: 7 mg/dL (ref 6–20)
CO2: 24 mmol/L (ref 22–32)
Calcium: 9.3 mg/dL (ref 8.9–10.3)
Chloride: 111 mmol/L (ref 98–111)
Creatinine, Ser: 0.93 mg/dL (ref 0.44–1.00)
GFR calc Af Amer: >60 mL/min (ref >60)
GFR calc non Af Amer: >60 mL/min (ref >60)
Glucose, Bld: 96 mg/dL (ref 70–99)
Potassium: 4.1 mmol/L (ref 3.5–5.1)
Sodium: 142 mmol/L (ref 135–145)
Total Bilirubin: 0.6 mg/dL (ref 0.3–1.2)
Total Protein: 7.8 g/dL (ref 6.5–8.1)

## 2018-07-28 LAB — WET PREP, GENITAL
Sperm: NONE SEEN
Trich, Wet Prep: NONE SEEN
Yeast Wet Prep HPF POC: NONE SEEN

## 2018-07-28 LAB — CHLAMYDIA/NGC RT PCR (ARMC ONLY)
Chlamydia Tr: NOT DETECTED
N gonorrhoeae: NOT DETECTED

## 2018-07-28 LAB — CBC
HCT: 39.7 % (ref 36.0–46.0)
Hemoglobin: 12.7 g/dL (ref 12.0–15.0)
MCH: 27.3 pg (ref 26.0–34.0)
MCHC: 32 g/dL (ref 30.0–36.0)
MCV: 85.2 fL (ref 80.0–100.0)
Platelets: 232 10*3/uL (ref 150–400)
RBC: 4.66 MIL/uL (ref 3.87–5.11)
RDW: 14 % (ref 11.5–15.5)
WBC: 5.3 10*3/uL (ref 4.0–10.5)
nRBC: 0 % (ref 0.0–0.2)

## 2018-07-28 LAB — POCT PREGNANCY, URINE: Preg Test, Ur: NEGATIVE

## 2018-07-28 MED ORDER — TRAMADOL HCL 50 MG PO TABS: 50.0000 mg | ORAL_TABLET | Freq: Four times a day (QID) | ORAL | 0 refills | Status: DC | PRN

## 2018-07-28 MED ORDER — SODIUM CHLORIDE 0.9% FLUSH: 3.0000 mL | Freq: Once | INTRAVENOUS | Status: DC

## 2018-07-28 MED ORDER — METRONIDAZOLE 500 MG PO TABS: 500.0000 mg | ORAL_TABLET | Freq: Two times a day (BID) | ORAL | 0 refills | Status: DC

## 2018-07-28 NOTE — ED Triage Notes (Signed)
C/O lower abdominal pain intermittently x 1 week.  Also c/o some heartburn.

## 2018-07-28 NOTE — ED Provider Notes (Signed)
[facility] Emergency Department Provider Note  ____________________________________________   First MD Initiated Contact with Patient 07/28/18 1342     (approximate)  I have reviewed the triage vital signs and the nursing notes.   HISTORY  Chief Complaint Abdominal Pain   HPI Linda Hester is a 28 y.o. female presents to the ED with complaint of abdominal cramping and pain for 1 to 2 days.  She states that she has had lower abdominal pain intermittently for 1 week.  She also states she has had a white vaginal discharge.  She denies any fever, chills, nausea or vomiting.  She does report some heartburn at times.  She denies any urinary symptoms.  She rates her pain as 7 out of 10.     Past Medical History:  Diagnosis Date  . Anemia   . Asthma   . Head trauma    as a child, denies neurological deficits    Patient Active Problem List   Diagnosis Date Noted  . Labor and delivery, indication for care 10/02/2017  . Pregnancy 09/07/2017  . Genital herpes 07/30/2017  . Fetal growth restriction 02/21/2015  . NST (non-stress test) reactive on fetal surveillance 02/18/2015  . Uterine size date discrepancy 02/08/2015  . Uterine size date discrepancy pregnancy 02/08/2015    Past Surgical History:  Procedure Laterality Date  . NO PAST SURGERIES      Prior to Admission medications   Medication Sig Start Date End Date Taking? Authorizing Provider  acetaminophen (TYLENOL) 500 MG tablet Take 1 tablet (500 mg total) by mouth every 6 (six) hours as needed. 01/29/18   Enid DerryWagner, Ashley, PA-C  albuterol (PROVENTIL HFA;VENTOLIN HFA) 108 (90 Base) MCG/ACT inhaler Inhale 2 puffs into the lungs every 6 (six) hours as needed for wheezing or shortness of breath.    [provider]  brompheniramine-pseudoephedrine-DM 30-2-10 MG/5ML syrup Take 5 mLs by mouth 4 (four) times daily as needed. 01/29/18   Enid DerryWagner, Ashley, PA-C  ferrous sulfate 325 (65 FE) MG tablet Take 1  tablet (325 mg total) by mouth 2 (two) times daily with a meal. 10/04/17   McVey, Prudencio Pairebecca A, CNM  guaiFENesin 200 MG tablet Take 2 tablets (400 mg total) by mouth every 4 (four) hours as needed for cough or to loosen phlegm. Patient not taking: Reported on 08/01/2017 03/16/17   Kem Boroughsriplett, Cari B, FNP  ibuprofen (ADVIL,MOTRIN) 600 MG tablet Take 1 tablet (600 mg total) by mouth every 6 (six) hours as needed. 01/29/18   Enid DerryWagner, Ashley, PA-C  medroxyPROGESTERone (DEPO-PROVERA) 150 MG/ML injection Inject 1 mL (150 mg total) into the muscle every 3 (three) months. Next dose due 12/1-15 10/04/17   McVey, Prudencio Pairebecca A, CNM  metroNIDAZOLE (FLAGYL) 500 MG tablet Take 1 tablet (500 mg total) by mouth 2 (two) times daily. 07/28/18   Tommi RumpsSummers, Latima Hamza L, PA-C  traMADol (ULTRAM) 50 MG tablet Take 1 tablet (50 mg total) by mouth every 6 (six) hours as needed. 07/28/18   Tommi RumpsSummers, Cottrell Gentles L, PA-C    Allergies Shellfish allergy  Family History  Problem Relation Age of Onset  . Asthma Mother   . Fibroids Mother   . Sickle cell trait Daughter   . Hypertension Maternal Grandmother     Social History Social History   Tobacco Use  . Smoking status: Current Some Day Smoker    Packs/day: 0.25    Types: Cigarettes  . Smokeless tobacco: Never Used  . Tobacco comment: "every now and then"   Substance  Use Topics  . Alcohol use: No  . Drug use: No    Comment: had + UDS 01-09-15 for MJ    Review of Systems Constitutional: No fever/chills Eyes: No visual changes. ENT: No sore throat. Cardiovascular: Denies chest pain. Respiratory: Denies shortness of breath. Gastrointestinal: Positive lower abdominal pain.  No nausea, no vomiting.  No diarrhea.  No constipation. Genitourinary: Negative for dysuria.  Positive vaginal discharge. Musculoskeletal: Negative for back pain. Skin: Negative for rash. Neurological: Negative for headaches, focal weakness or numbness. ____________________________________________   PHYSICAL  EXAM:  VITAL SIGNS: ED Triage Vitals  Enc Vitals Group     BP 07/28/18 1237 128/78     Pulse Rate 07/28/18 1237 91     Resp 07/28/18 1237 16     Temp 07/28/18 1237 99 F (37.2 C)     Temp Source 07/28/18 1237 Oral     SpO2 07/28/18 1237 98 %     Weight 07/28/18 1235 178 lb (80.7 kg)     Height 07/28/18 1235 5\' 9"  (1.753 m)     Head Circumference --      Peak Flow --      Pain Score 07/28/18 1235 7     Pain Loc --      Pain Edu? --      Excl. in GC? --    Constitutional: Alert and oriented. Well appearing and in no acute distress. Eyes: Conjunctivae are normal.  Head: Atraumatic. Neck: No stridor.   Cardiovascular: Normal rate, regular rhythm. Grossly normal heart sounds.  Good peripheral circulation. Respiratory: Normal respiratory effort.  No retractions. Lungs CTAB. Gastrointestinal: Soft and nontender. No distention.  No CVA tenderness.  Bowel sounds normoactive x4 quadrants. Genitourinary: There is a thick white exudate present along the vaginal walls.  Minimal tenderness to palpation left adnexal area but no masses were noted.  Negative for cervical motion tenderness. Musculoskeletal: Moves upper and lower extremities without any difficulty.  Normal gait was noted. Neurologic:  Normal speech and language. No gross focal neurologic deficits are appreciated. No gait instability. Skin:  Skin is warm, dry and intact. No rash noted. Psychiatric: Mood and affect are normal. Speech and behavior are normal.  ____________________________________________   LABS (all labs ordered are listed, but only abnormal results are displayed)  Labs Reviewed  WET PREP, GENITAL - Abnormal; Notable for the following components:      Result Value   Clue Cells Wet Prep HPF POC PRESENT (*)    WBC, Wet Prep HPF POC FEW (*)    All other components within normal limits  URINALYSIS, COMPLETE (UACMP) WITH MICROSCOPIC - Abnormal; Notable for the following components:   Color, Urine YELLOW (*)     APPearance HAZY (*)    Protein, ur 30 (*)    All other components within normal limits  CHLAMYDIA/NGC RT PCR ([facility] ONLY)  LIPASE, BLOOD  COMPREHENSIVE METABOLIC PANEL  CBC  POC URINE PREG, ED  POCT PREGNANCY, URINE    PROCEDURES  Procedure(s) performed (including Critical Care):  Procedures   ____________________________________________   INITIAL IMPRESSION / ASSESSMENT AND PLAN / ED COURSE  As part of my medical decision making, I reviewed the following data within the electronic MEDICAL RECORD NUMBER Notes from prior ED visits and [place] Controlled Substance Database  28 year old female presents to the ED with complaint of abdominal pain intermittently for the last week.  Patient states that abdominal pain has been localized to the pelvic region with a vaginal discharge.  Wet  prep is positive for clue cells and patient was made aware that she does have bacterial vaginosis which she suspected.  Flagyl was sent to her pharmacy as well as some tramadol if she needs it for pain.  At the time of discharge her GC and Chlamydia test were pending.  ____________________________________________   FINAL CLINICAL IMPRESSION(S) / ED DIAGNOSES  Final diagnoses:  BV (bacterial vaginosis)     ED Discharge Orders         Ordered    metroNIDAZOLE (FLAGYL) 500 MG tablet  2 times daily     07/28/18 1521    traMADol (ULTRAM) 50 MG tablet  Every 6 hours PRN     07/28/18 1521           Note:  This document was prepared using Dragon voice recognition software and may include unintentional dictation errors.    Johnn Hai, PA-C 07/28/18 1551    Merlyn Lot, MD 07/29/18 1341

## 2018-07-28 NOTE — ED Notes (Signed)
Burning centralized chest pain X 3 days, lower bilateral abdominal cramping X 1 days. Pt alert and oriented X4, active, cooperative, pt in NAD. RR even and unlabored, color WNL.

## 2018-07-28 NOTE — Discharge Instructions (Signed)
Follow-up with your primary care provider if any continued problems.  Begin taking Flagyl 500 mg twice daily until completely finished.  Tramadol is if you need anything for pain.  Be aware that you cannot drink alcohol with the antibiotic as it will cause severe nausea and vomiting.

## 2018-08-27 ENCOUNTER — Encounter: Payer: Self-pay | Admitting: Emergency Medicine

## 2018-08-27 ENCOUNTER — Emergency Department
Admission: EM | Admit: 2018-08-27 | Discharge: 2018-08-27 | Disposition: A | Discharge status: Home / Self Care | Payer: Self-pay | Attending: Emergency Medicine | Admitting: Emergency Medicine

## 2018-08-27 ENCOUNTER — Other Ambulatory Visit: Payer: Self-pay

## 2018-08-27 DIAGNOSIS — B9689 Other specified bacterial agents as the cause of diseases classified elsewhere: Secondary | ICD-10-CM | POA: Insufficient documentation

## 2018-08-27 DIAGNOSIS — J45909 Unspecified asthma, uncomplicated: Secondary | ICD-10-CM | POA: Insufficient documentation

## 2018-08-27 DIAGNOSIS — F17210 Nicotine dependence, cigarettes, uncomplicated: Secondary | ICD-10-CM | POA: Insufficient documentation

## 2018-08-27 DIAGNOSIS — N760 Acute vaginitis: Secondary | ICD-10-CM | POA: Insufficient documentation

## 2018-08-27 DIAGNOSIS — Z79899 Other long term (current) drug therapy: Secondary | ICD-10-CM | POA: Insufficient documentation

## 2018-08-27 LAB — URINALYSIS, COMPLETE (UACMP) WITH MICROSCOPIC
Bacteria, UA: NONE SEEN
Bilirubin Urine: NEGATIVE
Glucose, UA: NEGATIVE mg/dL
Hgb urine dipstick: NEGATIVE
Ketones, ur: NEGATIVE mg/dL
Leukocytes,Ua: NEGATIVE
Nitrite: NEGATIVE
Protein, ur: NEGATIVE mg/dL
Specific Gravity, Urine: 1.018 (ref 1.005–1.030)
pH: 7 (ref 5.0–8.0)

## 2018-08-27 LAB — CHLAMYDIA/NGC RT PCR (ARMC ONLY)
Chlamydia Tr: NOT DETECTED
N gonorrhoeae: NOT DETECTED

## 2018-08-27 LAB — WET PREP, GENITAL
Sperm: NONE SEEN
Trich, Wet Prep: NONE SEEN
WBC, Wet Prep HPF POC: NONE SEEN
Yeast Wet Prep HPF POC: NONE SEEN

## 2018-08-27 LAB — POCT PREGNANCY, URINE: Preg Test, Ur: NEGATIVE

## 2018-08-27 MED ORDER — PHENAZOPYRIDINE HCL 200 MG PO TABS: 200.0000 mg | ORAL_TABLET | Freq: Three times a day (TID) | ORAL | 0 refills | Status: DC | PRN

## 2018-08-27 MED ORDER — CLINDAMYCIN HCL 300 MG PO CAPS: 300.0000 mg | ORAL_CAPSULE | Freq: Two times a day (BID) | ORAL | 0 refills | Status: AC

## 2018-08-27 NOTE — ED Notes (Signed)
Patient reports discharge and blood after urinating. Patient states bleeding is not like a period flow. No clots. Patient states she was recently diagnosed and treated for bacterial vaginitis.

## 2018-08-27 NOTE — Discharge Instructions (Signed)
Please follow-up with the health department in 2 weeks if you are not improving.  No intercourse until you have completed your antibiotic course.

## 2018-08-27 NOTE — ED Provider Notes (Signed)
[facility] Emergency Department Provider Note  ____________________________________________  Time seen: Approximately 7:05 PM  I have reviewed the triage vital signs and the nursing notes.   HISTORY  Chief Complaint Vaginal Discharge and Dysuria    HPI Linda Hester is a 28 y.o. female who presents to the emergency department for treatment and evaluation of vaginal discharge for the past 3 days.  She was recently treated for BV.  She does state she finished her antibiotics.  She also complains of dysuria and urinary frequency with some lower pelvic pain.   Past Medical History:  Diagnosis Date  . Anemia   . Asthma   . Head trauma    as a child, denies neurological deficits    Patient Active Problem List   Diagnosis Date Noted  . Labor and delivery, indication for care 10/02/2017  . Pregnancy 09/07/2017  . Genital herpes 07/30/2017  . Fetal growth restriction 02/21/2015  . NST (non-stress test) reactive on fetal surveillance 02/18/2015  . Uterine size date discrepancy 02/08/2015  . Uterine size date discrepancy pregnancy 02/08/2015    Past Surgical History:  Procedure Laterality Date  . NO PAST SURGERIES      Prior to Admission medications   Medication Sig Start Date End Date Taking? Authorizing Provider  acetaminophen (TYLENOL) 500 MG tablet Take 1 tablet (500 mg total) by mouth every 6 (six) hours as needed. 01/29/18   Enid DerryWagner, Ashley, PA-C  albuterol (PROVENTIL HFA;VENTOLIN HFA) 108 (90 Base) MCG/ACT inhaler Inhale 2 puffs into the lungs every 6 (six) hours as needed for wheezing or shortness of breath.    [provider]  brompheniramine-pseudoephedrine-DM 30-2-10 MG/5ML syrup Take 5 mLs by mouth 4 (four) times daily as needed. 01/29/18   Enid DerryWagner, Ashley, PA-C  clindamycin (CLEOCIN) 300 MG capsule Take 1 capsule (300 mg total) by mouth 2 (two) times daily for 7 days. 08/27/18 09/03/18  Shandy Checo, Rulon Eisenmengerari B, FNP  ferrous sulfate 325 (65 FE) MG  tablet Take 1 tablet (325 mg total) by mouth 2 (two) times daily with a meal. 10/04/17   McVey, Prudencio Pairebecca A, CNM  guaiFENesin 200 MG tablet Take 2 tablets (400 mg total) by mouth every 4 (four) hours as needed for cough or to loosen phlegm. Patient not taking: Reported on 08/01/2017 03/16/17   Kem Boroughsriplett, Jhon Mallozzi B, FNP  ibuprofen (ADVIL,MOTRIN) 600 MG tablet Take 1 tablet (600 mg total) by mouth every 6 (six) hours as needed. 01/29/18   Enid DerryWagner, Ashley, PA-C  medroxyPROGESTERone (DEPO-PROVERA) 150 MG/ML injection Inject 1 mL (150 mg total) into the muscle every 3 (three) months. Next dose due 12/1-15 10/04/17   McVey, Prudencio Pairebecca A, CNM  metroNIDAZOLE (FLAGYL) 500 MG tablet Take 1 tablet (500 mg total) by mouth 2 (two) times daily. 07/28/18   Tommi RumpsSummers, Rhonda L, PA-C  phenazopyridine (PYRIDIUM) 200 MG tablet Take 1 tablet (200 mg total) by mouth 3 (three) times daily as needed for pain. 08/27/18   Perl Kerney B, FNP  traMADol (ULTRAM) 50 MG tablet Take 1 tablet (50 mg total) by mouth every 6 (six) hours as needed. 07/28/18   Tommi RumpsSummers, Rhonda L, PA-C    Allergies Shellfish allergy  Family History  Problem Relation Age of Onset  . Asthma Mother   . Fibroids Mother   . Sickle cell trait Daughter   . Hypertension Maternal Grandmother     Social History Social History   Tobacco Use  . Smoking status: Current Some Day Smoker    Packs/day: 0.25  Types: Cigarettes  . Smokeless tobacco: Never Used  . Tobacco comment: "every now and then"   Substance Use Topics  . Alcohol use: No  . Drug use: No    Comment: had + UDS 01-09-15 for MJ    Review of Systems Constitutional: Negative for fever. Respiratory: Negative for shortness of breath or cough. Gastrointestinal: Positive for abdominal pain; negative for nausea , negative for vomiting. Genitourinary: Positive for dysuria , positive for vaginal discharge. Musculoskeletal: Negative for back pain. Skin: Negative for acute skin  changes/rash/lesion. ____________________________________________   PHYSICAL EXAM:  VITAL SIGNS: ED Triage Vitals  Enc Vitals Group     BP 08/27/18 1737 135/87     Pulse Rate 08/27/18 1737 87     Resp 08/27/18 1737 16     Temp 08/27/18 1737 99 F (37.2 C)     Temp Source 08/27/18 1737 Oral     SpO2 08/27/18 1737 98 %     Weight 08/27/18 1740 178 lb (80.7 kg)     Height 08/27/18 1740 5\' 9"  (1.753 m)     Head Circumference --      Peak Flow --      Pain Score 08/27/18 1740 8     Pain Loc --      Pain Edu? --      Excl. in [place]? --     Constitutional: Alert and oriented. Well appearing and in no acute distress. Eyes: Conjunctivae are normal. Head: Atraumatic. Nose: No congestion/rhinnorhea. Mouth/Throat: Mucous membranes are moist. Respiratory: Normal respiratory effort.  No retractions. Gastrointestinal: Bowel sounds active x 4; Abdomen is soft without rebound or guarding. Genitourinary: Pelvic exam: Dark brown discharge noted at the cervical os. No lesions noted on the cervix. No CMT. Musculoskeletal: No extremity tenderness nor edema.  Neurologic:  Normal speech and language. No gross focal neurologic deficits are appreciated. Speech is normal. No gait instability. Skin:  Skin is warm, dry and intact. No rash noted on exposed skin. Psychiatric: Mood and affect are normal. Speech and behavior are normal.  ____________________________________________   LABS (all labs ordered are listed, but only abnormal results are displayed)  Labs Reviewed  WET PREP, GENITAL - Abnormal; Notable for the following components:      Result Value   Clue Cells Wet Prep HPF POC PRESENT (*)    All other components within normal limits  URINALYSIS, COMPLETE (UACMP) WITH MICROSCOPIC - Abnormal; Notable for the following components:   Color, Urine YELLOW (*)    APPearance CLEAR (*)    All other components within normal limits  CHLAMYDIA/NGC RT PCR ([facility] ONLY)  POC URINE PREG, ED   POCT PREGNANCY, URINE   ____________________________________________  RADIOLOGY  Not indicated. ____________________________________________  Procedures  ____________________________________________  28 year old female presenting to the emergency department for treatment and evaluation of vaginal discharge with dysuria.  She states that she feels the same as she did on her previous visit.  Chart review shows that she was negative for chlamydia and gonorrhea and positive for clue cells. Testing repeated today. She is again positive for BV. Alternate treatment with clindamycin will be done this time. She was advised that she will need to follow up with the health department if not improving.  INITIAL IMPRESSION / ASSESSMENT AND PLAN / ED COURSE  Pertinent labs & imaging results that were available during my care of the patient were reviewed by me and considered in my medical decision making (see chart for details).  ____________________________________________   FINAL CLINICAL  IMPRESSION(S) / ED DIAGNOSES  Final diagnoses:  BV (bacterial vaginosis)    Note:  This document was prepared using Dragon voice recognition software and may include unintentional dictation errors.   Victorino Dike, FNP 08/27/18 2209    Schuyler Amor, MD 08/27/18 2342

## 2018-08-27 NOTE — ED Triage Notes (Signed)
Patient presents to the ED with bloody and thick vaginal discharge since Tuesday.  Patient was diagnosed with BV recently and states she finished the course of antibiotics completely.  Patient also reports dysuria and urinary frequency.  Patient reports pelvic pain.

## 2018-09-07 ENCOUNTER — Other Ambulatory Visit: Payer: Self-pay

## 2018-09-07 DIAGNOSIS — Z20822 Contact with and (suspected) exposure to covid-19: Secondary | ICD-10-CM

## 2018-09-08 LAB — NOVEL CORONAVIRUS, NAA: SARS-CoV-2, NAA: NOT DETECTED

## 2018-09-22 ENCOUNTER — Other Ambulatory Visit: Payer: Self-pay

## 2018-09-22 ENCOUNTER — Ambulatory Visit (LOCAL_COMMUNITY_HEALTH_CENTER): Payer: Self-pay

## 2018-09-22 VITALS — BP 110/60 | Ht 69.0 in | Wt 178.0 lb

## 2018-09-22 DIAGNOSIS — Z30013 Encounter for initial prescription of injectable contraceptive: Secondary | ICD-10-CM

## 2018-09-22 DIAGNOSIS — Z3009 Encounter for other general counseling and advice on contraception: Secondary | ICD-10-CM

## 2018-09-23 MED ORDER — MEDROXYPROGESTERONE ACETATE 150 MG/ML IM SUSP
150.0000 mg | Freq: Once | INTRAMUSCULAR | Status: AC
  Administered 2018-09-22: 10:00:00 150 mg via INTRAMUSCULAR

## 2018-09-23 NOTE — Progress Notes (Signed)
Depo given per C. Hampton PA 10/2018 order. Tolerated well Aileen Fass, RN

## 2018-12-21 ENCOUNTER — Other Ambulatory Visit: Payer: Self-pay

## 2018-12-21 ENCOUNTER — Ambulatory Visit (LOCAL_COMMUNITY_HEALTH_CENTER): Payer: Self-pay

## 2018-12-21 VITALS — BP 121/69 | Ht 69.0 in | Wt 173.5 lb

## 2018-12-21 DIAGNOSIS — Z30013 Encounter for initial prescription of injectable contraceptive: Secondary | ICD-10-CM

## 2018-12-21 DIAGNOSIS — Z3009 Encounter for other general counseling and advice on contraception: Secondary | ICD-10-CM

## 2018-12-21 MED ORDER — MEDROXYPROGESTERONE ACETATE 150 MG/ML IM SUSP
150.0000 mg | Freq: Once | INTRAMUSCULAR | Status: AC
  Administered 2018-12-21: 150 mg via INTRAMUSCULAR

## 2018-12-21 NOTE — Progress Notes (Signed)
Last physical at [facility] 10/27/2017 (1 year depo order by Antoine Primas, PA). Last depo at [facility] was 09/22/2018; 12.6 weeks post depo today. DMPA 150 mg IM today per Dr. Lauretta Chester standing order for one time depo when physical is due.

## 2019-01-06 ENCOUNTER — Other Ambulatory Visit: Payer: Self-pay

## 2019-01-06 ENCOUNTER — Ambulatory Visit: Payer: HRSA Program | Attending: Internal Medicine

## 2019-01-06 DIAGNOSIS — Z20828 Contact with and (suspected) exposure to other viral communicable diseases: Secondary | ICD-10-CM | POA: Diagnosis present

## 2019-01-06 DIAGNOSIS — Z20822 Contact with and (suspected) exposure to covid-19: Secondary | ICD-10-CM

## 2019-01-07 LAB — NOVEL CORONAVIRUS, NAA: SARS-CoV-2, NAA: NOT DETECTED

## 2019-01-07 NOTE — Progress Notes (Signed)
Order(s) created erroneously. Erroneous order ID: [id number]  Order moved by: Hilbert Briggs M  Order move date/time: 01/07/2019 2:49 PM  Source Patient: [id number]  Source Contact: 01/06/2019  Destination Patient: [id number]  Destination Contact: 01/06/2019 

## 2019-01-07 NOTE — Progress Notes (Signed)
Order(s) created erroneously. Erroneous order ID: [id number]  Order moved by: Brigitte Pulse  Order move date/time: 01/07/2019 2:49 PM  Source Patient: [id number]  Source Contact: 01/06/2019  Destination Patient: [id number]  Destination Contact: 01/06/2019

## 2019-01-07 NOTE — Progress Notes (Signed)
Orders moved to this encounter.  

## 2019-02-05 ENCOUNTER — Other Ambulatory Visit: Payer: Self-pay

## 2019-02-05 ENCOUNTER — Emergency Department: Payer: HRSA Program

## 2019-02-05 ENCOUNTER — Encounter: Payer: Self-pay | Admitting: Emergency Medicine

## 2019-02-05 ENCOUNTER — Emergency Department
Admission: EM | Admit: 2019-02-05 | Discharge: 2019-02-05 | Disposition: A | Discharge status: Home / Self Care | Payer: HRSA Program | Attending: Emergency Medicine | Admitting: Emergency Medicine

## 2019-02-05 DIAGNOSIS — F17210 Nicotine dependence, cigarettes, uncomplicated: Secondary | ICD-10-CM | POA: Diagnosis not present

## 2019-02-05 DIAGNOSIS — R519 Headache, unspecified: Secondary | ICD-10-CM | POA: Diagnosis not present

## 2019-02-05 DIAGNOSIS — Z20822 Contact with and (suspected) exposure to covid-19: Secondary | ICD-10-CM | POA: Diagnosis not present

## 2019-02-05 DIAGNOSIS — R0789 Other chest pain: Secondary | ICD-10-CM | POA: Insufficient documentation

## 2019-02-05 DIAGNOSIS — R5383 Other fatigue: Secondary | ICD-10-CM | POA: Insufficient documentation

## 2019-02-05 DIAGNOSIS — J45909 Unspecified asthma, uncomplicated: Secondary | ICD-10-CM | POA: Insufficient documentation

## 2019-02-05 MED ORDER — AZITHROMYCIN 250 MG PO TABS: 250.0000 mg | ORAL_TABLET | Freq: Every day | ORAL | 0 refills | Status: DC

## 2019-02-05 MED ORDER — AZITHROMYCIN 250 MG PO TABS: 250.0000 mg | ORAL_TABLET | Freq: Every day | ORAL | 0 refills | Status: AC

## 2019-02-05 MED ORDER — PREDNISONE 20 MG PO TABS
60.0000 mg | ORAL_TABLET | Freq: Once | ORAL | Status: AC
  Administered 2019-02-05: 19:00:00 60 mg via ORAL
  Filled 2019-02-05: qty 3

## 2019-02-05 MED ORDER — BENZONATATE 100 MG PO CAPS: ORAL_CAPSULE | ORAL | 0 refills | Status: DC

## 2019-02-05 MED ORDER — PREDNISONE 20 MG PO TABS: ORAL_TABLET | ORAL | 0 refills | Status: DC

## 2019-02-05 MED ORDER — AZITHROMYCIN 500 MG PO TABS
500.0000 mg | ORAL_TABLET | Freq: Once | ORAL | Status: AC
  Administered 2019-02-05: 19:00:00 500 mg via ORAL
  Filled 2019-02-05: qty 1

## 2019-02-05 MED ORDER — ALBUTEROL SULFATE HFA 108 (90 BASE) MCG/ACT IN AERS: 2.0000 | INHALATION_SPRAY | Freq: Four times a day (QID) | RESPIRATORY_TRACT | 0 refills | Status: DC | PRN

## 2019-02-05 NOTE — ED Provider Notes (Signed)
[facility] Emergency Department Provider Note ____________________________________________  Time seen: 1837  I have reviewed the triage vital signs and the nursing notes.  HISTORY  Chief Complaint  COVID exposure  HPI Linda Hester is a 29 y.o. female presents herself to the ED for evaluation of symptoms concerning for possible Covid exposure.  Patient describes that her coworker apparently tested positive for Covid and the patient was notified of results today.  She describes burning in her chest with breathing,  as well as generalized fatigue and headache.  She denies any frank fevers.  She does have a history of asthma, and takes albuterol inhaler, although she is currently out of that prescription.  She denies any other symptoms at this time.  She has been previously tested for Covid reports those tests have all been negative.  Past Medical History:  Diagnosis Date  . Anemia   . Asthma   . Head trauma    as a child, denies neurological deficits    Patient Active Problem List   Diagnosis Date Noted  . Labor and delivery, indication for care 10/02/2017  . Pregnancy 09/07/2017  . Genital herpes 07/30/2017  . Fetal growth restriction 02/21/2015  . NST (non-stress test) reactive on fetal surveillance 02/18/2015  . Uterine size date discrepancy 02/08/2015  . Uterine size date discrepancy pregnancy 02/08/2015    Past Surgical History:  Procedure Laterality Date  . NO PAST SURGERIES      Prior to Admission medications   Medication Sig Start Date End Date Taking? Authorizing Provider  albuterol (PROVENTIL HFA;VENTOLIN HFA) 108 (90 Base) MCG/ACT inhaler Inhale 2 puffs into the lungs every 6 (six) hours as needed for wheezing or shortness of breath.    [provider]  albuterol (VENTOLIN HFA) 108 (90 Base) MCG/ACT inhaler Inhale 2 puffs into the lungs every 6 (six) hours as needed for wheezing or shortness of breath. 02/05/19   Jameison Haji, Charlesetta Ivory, PA-C  azithromycin (ZITHROMAX Z-PAK) 250 MG tablet Take 1 tablet (250 mg total) by mouth daily for 4 days. 02/06/19 02/10/19  Cindra Austad, Charlesetta Ivory, PA-C  benzonatate (TESSALON PERLES) 100 MG capsule Take 1-2 tabs TID prn cough 02/05/19   Benett Swoyer, Charlesetta Ivory, PA-C  predniSONE (DELTASONE) 20 MG tablet Take 3 tabs daily x 2 days; Take 2 tabs daily x 3 days; Take 1 tab daily x 3; Take 0.5 tabs daily x 4 days 02/06/19   Jonluke Cobbins, Charlesetta Ivory, PA-C  ferrous sulfate 325 (65 FE) MG tablet Take 1 tablet (325 mg total) by mouth 2 (two) times daily with a meal. Patient not taking: Reported on 12/21/2018 10/04/17 02/05/19  McVey, Prudencio Pair, CNM    Allergies Shellfish allergy  Family History  Problem Relation Age of Onset  . Asthma Mother   . Fibroids Mother   . Sickle cell trait Daughter   . Hypertension Maternal Grandmother     Social History Social History   Tobacco Use  . Smoking status: Current Some Day Smoker    Packs/day: 0.25    Types: Cigarettes  . Smokeless tobacco: Never Used  . Tobacco comment: "every now and then"   Substance Use Topics  . Alcohol use: No  . Drug use: No    Comment: had + UDS 01-09-15 for MJ    Review of Systems  Constitutional: Negative for fever.  Generalized fatigue. Eyes: Negative for visual changes. ENT: Negative for sore throat. Cardiovascular: Negative for chest pain. Respiratory: Positive for shortness  of breath. Gastrointestinal: Negative for abdominal pain, vomiting and diarrhea. Genitourinary: Negative for dysuria. Musculoskeletal: Negative for back pain. Skin: Negative for rash. Neurological: Negative for focal weakness or numbness.  Reports headache as above. ____________________________________________  PHYSICAL EXAM:  VITAL SIGNS: ED Triage Vitals  Enc Vitals Group     BP 02/05/19 1626 (!) 146/99     Pulse Rate 02/05/19 1626 (!) 103     Resp 02/05/19 1626 18     Temp 02/05/19 1626 98.7 F (37.1 C)     Temp Source  02/05/19 1626 Oral     SpO2 02/05/19 1626 100 %     Weight 02/05/19 1636 173 lb (78.5 kg)     Height 02/05/19 1636 5\' 9"  (1.753 m)     Head Circumference --      Peak Flow --      Pain Score 02/05/19 1636 7     Pain Loc --      Pain Edu? --      Excl. in [place]? --     Constitutional: Alert and oriented. Well appearing and in no distress. Head: Normocephalic and atraumatic. Eyes: Conjunctivae are normal. Normal extraocular movements Cardiovascular: Normal rate, regular rhythm. Normal distal pulses. Respiratory: Normal respiratory effort. No wheezes/rales/rhonchi. Musculoskeletal: Nontender with normal range of motion in all extremities.  Neurologic:  Normal gait without ataxia. Normal speech and language. No gross focal neurologic deficits are appreciated. Skin:  Skin is warm, dry and intact. No rash noted. Psychiatric: Mood and affect are normal. Patient exhibits appropriate insight and judgment. ____________________________________________   LABS (pertinent positives/negatives) Labs Reviewed  SARS CORONAVIRUS 2 (TAT 6-24 HRS)  ____________________________________________   RADIOLOGY  CXR 1V IMPRESSION: No acute cardiopulmonary abnormality. ____________________________________________  PROCEDURES  Prednisone 60 mg PO Azithromycin 500 mg PO  Procedures ____________________________________________  INITIAL IMPRESSION / ASSESSMENT AND PLAN / ED COURSE  Patient with ED evaluation of concern of a possible Covid exposure and symptoms.  She presents with malaise, fatigue, headache, and some mild burning with inspiration.  Her x-ray reveals no acute process.  Her Covid test is pending at the time of discharge.  Patient will be treated empirically for suspected Covid infection.  Prescriptions for prednisone, azithromycin, Tessalon Perles, and albuterol be sent to her pharmacy.  Work note holding her work for the next 2 days pending results is given in the ED.  Return precautions have  been reviewed.  Linda Hester was evaluated in Emergency Department on 02/05/2019 for the symptoms described in the history of present illness. She was evaluated in the context of the global COVID-19 pandemic, which necessitated consideration that the patient might be at risk for infection with the SARS-CoV-2 virus that causes COVID-19. Institutional protocols and algorithms that pertain to the evaluation of patients at risk for COVID-19 are in a state of rapid change based on information released by regulatory bodies including the CDC and federal and state organizations. These policies and algorithms were followed during the patient's care in the ED. ____________________________________________  FINAL CLINICAL IMPRESSION(S) / ED DIAGNOSES  Final diagnoses:  Close exposure to COVID-19 virus      Melvenia Needles, PA-C 02/05/19 1918    Duffy Bruce, MD 02/07/19 1235

## 2019-02-05 NOTE — Discharge Instructions (Addendum)
You are being tested and treated for COVID. You should remain out of work until your results are verified, and you symptoms improve. Follow-up with your provider or return for worsening symptoms.

## 2019-02-05 NOTE — ED Notes (Signed)
First Nurse Note: Pt to ED via POV stating that she has been exposed to COVID and does not feel well. Pt states that she if gasping for air, however, pt is speaking in complete sentences with no distress noted. Respirations are equal and unlabored at this time.

## 2019-02-05 NOTE — ED Triage Notes (Signed)
States worked all weak with person that had COVID. States yesterday she started to feel headache, burning with inspiration, fatigued.

## 2019-02-05 NOTE — ED Notes (Signed)
Pt states she has had a loss of appetite since the middle of the week, and starting yesterday everything has tasted sour. Pt states co-worker has been at work after testing positive for COVID.

## 2019-02-05 NOTE — ED Notes (Signed)
Pt states she has had intermittant headaches since yesterday, with mid-sternal chest pain w/ deep breathing, and cough.

## 2019-02-06 LAB — SARS CORONAVIRUS 2 (TAT 6-24 HRS): SARS Coronavirus 2: NEGATIVE

## 2019-03-15 ENCOUNTER — Other Ambulatory Visit: Payer: Self-pay | Admitting: Physician Assistant

## 2019-03-15 DIAGNOSIS — Z3042 Encounter for surveillance of injectable contraceptive: Secondary | ICD-10-CM

## 2019-03-15 MED ORDER — MEDROXYPROGESTERONE ACETATE 150 MG/ML IM SUSP
150.0000 mg | Freq: Once | INTRAMUSCULAR | Status: AC
  Administered 2019-03-16: 17:00:00 150 mg via INTRAMUSCULAR

## 2019-03-15 NOTE — Progress Notes (Signed)
Per patient chart, last RP 10/2017.  CBE due 2022 and pap due 2020.  Provided that patient desires to continue with Depo and BP is normal, may have Depo 03/16/19.  Will need a provider visit in May for pap and depo.

## 2019-03-16 ENCOUNTER — Ambulatory Visit (LOCAL_COMMUNITY_HEALTH_CENTER): Payer: Self-pay

## 2019-03-16 ENCOUNTER — Other Ambulatory Visit: Payer: Self-pay

## 2019-03-16 VITALS — BP 117/73 | Ht 69.0 in | Wt 181.5 lb

## 2019-03-16 DIAGNOSIS — Z30013 Encounter for initial prescription of injectable contraceptive: Secondary | ICD-10-CM

## 2019-03-16 NOTE — Progress Notes (Signed)
Client counseled that PAP is due and needed appt with provider for PAP when Depo due in May 2021. Client verbalized understanding. Depo administered per 03/15/19 written order of Sadie Haber PA and client tolerated without complaint. Jossie Ng, RN

## 2019-06-08 ENCOUNTER — Other Ambulatory Visit: Payer: Self-pay

## 2019-06-08 ENCOUNTER — Ambulatory Visit (LOCAL_COMMUNITY_HEALTH_CENTER): Payer: Self-pay

## 2019-06-08 VITALS — BP 118/76 | Ht 69.0 in | Wt 176.0 lb

## 2019-06-08 DIAGNOSIS — Z3009 Encounter for other general counseling and advice on contraception: Secondary | ICD-10-CM

## 2019-06-08 DIAGNOSIS — Z30013 Encounter for initial prescription of injectable contraceptive: Secondary | ICD-10-CM

## 2019-06-08 MED ORDER — MEDROXYPROGESTERONE ACETATE 150 MG/ML IM SUSP
150.0000 mg | Freq: Once | INTRAMUSCULAR | Status: AC
  Administered 2019-06-08: 150 mg via INTRAMUSCULAR

## 2019-06-08 NOTE — Progress Notes (Signed)
Client counseled physical overdue and needs to schedule when [place] due 08/24/2019. Client verbalized understanding. Consult with Hazle Coca CNM regarding need for Depo and she was notified client counseled to schedule physical appt. Per her verbal order, may administer Depo 150 mg IM x1 today. Client tolerated injection without complaint. Jossie Ng, RN

## 2019-09-06 ENCOUNTER — Other Ambulatory Visit: Payer: Self-pay

## 2019-09-06 ENCOUNTER — Ambulatory Visit (LOCAL_COMMUNITY_HEALTH_CENTER): Payer: Self-pay | Admitting: Advanced Practice Midwife

## 2019-09-06 ENCOUNTER — Encounter: Payer: Self-pay | Admitting: Advanced Practice Midwife

## 2019-09-06 VITALS — BP 123/79 | Ht 69.0 in | Wt 168.0 lb

## 2019-09-06 DIAGNOSIS — Z3009 Encounter for other general counseling and advice on contraception: Secondary | ICD-10-CM

## 2019-09-06 DIAGNOSIS — Z3042 Encounter for surveillance of injectable contraceptive: Secondary | ICD-10-CM

## 2019-09-06 DIAGNOSIS — B373 Candidiasis of vulva and vagina: Secondary | ICD-10-CM

## 2019-09-06 DIAGNOSIS — F1290 Cannabis use, unspecified, uncomplicated: Secondary | ICD-10-CM

## 2019-09-06 DIAGNOSIS — J45909 Unspecified asthma, uncomplicated: Secondary | ICD-10-CM

## 2019-09-06 DIAGNOSIS — F17200 Nicotine dependence, unspecified, uncomplicated: Secondary | ICD-10-CM | POA: Insufficient documentation

## 2019-09-06 DIAGNOSIS — B3731 Acute candidiasis of vulva and vagina: Secondary | ICD-10-CM

## 2019-09-06 LAB — WET PREP FOR TRICH, YEAST, CLUE: Trichomonas Exam: NEGATIVE

## 2019-09-06 MED ORDER — MULTIVITAMINS PO CAPS: 1.0000 | ORAL_CAPSULE | Freq: Every day | ORAL | 0 refills | Status: AC

## 2019-09-06 MED ORDER — MEDROXYPROGESTERONE ACETATE 150 MG/ML IM SUSP
150.0000 mg | INTRAMUSCULAR | Status: AC
  Administered 2019-09-06 – 2020-06-28 (×4): 150 mg via INTRAMUSCULAR

## 2019-09-06 MED ORDER — CLOTRIMAZOLE 1 % VA CREA: 1.0000 | TOPICAL_CREAM | Freq: Every day | VAGINAL | 0 refills | Status: AC

## 2019-09-06 NOTE — Progress Notes (Signed)
[facility] [address] Main Number: [phone]    Family Planning Visit- Initial Visit  Subjective:  Linda Hester is a 29 y.o. SBF [id number] smoker  being seen today for an initial well woman visit and to discuss family planning options.  She is currently using Depo Provera for pregnancy prevention. Patient reports she does not want a pregnancy in the next year.  Patient has the following medical conditions has Fetal growth restriction; Genital herpes; Smoker 3 cpd; and Marijuana use daily on their problem list.  Chief Complaint  Patient presents with  . Contraception    Physical and Depo    Patient reports LMP 08/25/19.  Last sex 09/01/19 without condom; with current partner x 6 years; 1 sex partner in last 3 mo.  Smoker 3 cpd.  MJ daily.  Last ETOH 09/03/19 (3 c. Vodka)q 2 mo.  Living with boyfriend and her 3 kids (10,4,1).  Working 10 hrs/day and 45 hrs/wk.  Last PE 10/27/17.  Last pap 03/29/15 neg.  Last DMPA 06/08/19.  CBE due 2022  Patient denies street drug use  Body mass index is 24.81 kg/m. - Patient is eligible for diabetes screening based on BMI and age >54?  not applicable HA1C ordered? not applicable  Patient reports 1 of partners in last year. Desires STI screening?  Yes  Has patient been screened once for HCV in the past?  No  No results found for: HCVAB  Does the patient have current drug use (including MJ), have a partner with drug use, and/or has been incarcerated since last result? Yes  If yes-- Screen for HCV through [facility]   Does the patient meet criteria for HBV testing? No  Criteria:  -Household, sexual or needle sharing contact with HBV -History of drug use -HIV positive -Those with known Hep C   Health Maintenance Due  Topic Date Due  . Hepatitis C Screening  Never done  . COVID-19 Vaccine (1) Never done  . PAP SMEAR-Modifier  03/31/2018  . INFLUENZA VACCINE  08/21/2019    Review  of Systems  Respiratory: Positive for shortness of breath (due to asthma per pt).   Genitourinary: Positive for frequency (voiding small amts with frequency).  Psychiatric/Behavioral: Positive for depression (PHQ-9=11).  All other systems reviewed and are negative.   The following portions of the patient's history were reviewed and updated as appropriate: allergies, current medications, past family history, past medical history, past social history, past surgical history and problem list. Problem list updated.   See flowsheet for other program required questions.  Objective:   Vitals:   09/06/19 1020  BP: 123/79  Weight: 168 lb (76.2 kg)  Height: 5\' 9"  (1.753 m)    Physical Exam Constitutional:      Appearance: Normal appearance. She is normal weight.  HENT:     Head: Normocephalic and atraumatic.     Mouth/Throat:     Mouth: Mucous membranes are moist.  Eyes:     Conjunctiva/sclera: Conjunctivae normal.  Cardiovascular:     Rate and Rhythm: Normal rate and regular rhythm.  Pulmonary:     Effort: Pulmonary effort is normal.     Breath sounds: Normal breath sounds.  Abdominal:     Palpations: Abdomen is soft.     Comments: Soft without tenderness  Genitourinary:    General: Normal vulva.     Exam position: Lithotomy position.     Vagina: Vaginal discharge (thick grey clumpy leukorrhea,  ph>4.5; c/o increased d/c and external itchng) present.     Cervix: Normal.     Uterus: Normal.      Adnexa: Right adnexa normal and left adnexa normal.     Rectum: Normal.     Comments: cx deviated to left.  Pap done Musculoskeletal:        General: Normal range of motion.     Cervical back: Normal range of motion and neck supple.  Skin:    General: Skin is warm and dry.  Neurological:     Mental Status: She is alert.  Psychiatric:        Mood and Affect: Mood normal.       Assessment and Plan:  Linda Hester is a 29 y.o. female presenting to the [facility]  Department for an initial well woman exam/family planning visit  Contraception counseling: Reviewed all forms of birth control options in the tiered based approach. available including abstinence; over the counter/barrier methods; hormonal contraceptive medication including pill, patch, ring, injection,contraceptive implant, ECP; hormonal and nonhormonal IUDs; permanent sterilization options including vasectomy and the various tubal sterilization modalities. Risks, benefits, and typical effectiveness rates were reviewed.  Questions were answered.  Written information was also given to the patient to review.  Patient desires DMPA, this was prescribed for patient. She will follow up in 11-13 wks for surveillance.  She was told to call with any further questions, or with any concerns about this method of contraception.  Emphasized use of condoms 100% of the time for STI prevention.  Patient was offered ECP. ECP was not accepted by the patient. ECP counseling was not given - see RN documentation  1. Family planning Please give primary care MD list to pt; referred for numerous c/o Treat wet mount per standing orders Immunization nurse consult Please give pt Kathreen Cosier, LCSW contact info - WET PREP FOR TRICH, YEAST, CLUE - HIV [place] [facility] - Chlamydia/Gonorrhea [place] [facility] - Syphilis Serology, [place] [facility] - IGP, [place] HPV - Ambulatory referral to Behavioral Health  2. Encounter for surveillance of injectable contraceptive May have DMPA 150 mg IM q 11-13 wks x 1 year   3. Smoker 3 cpd Counseled via 5 A's to stop smoking  4. Marijuana use daily Counseled not to use     No follow-ups on file.  No future appointments.  Alberteen Spindle, CNM

## 2019-09-06 NOTE — Progress Notes (Signed)
Here today for PE and Depo. Last PE here was 10/27/2017, last Pap Smear was 03/29/2015 and last Depo was 06/08/2019 (12.3 weeks.) Per [facility] CBE due 2022. Wants all STD screening today. Tawny Hopping, RN

## 2019-09-06 NOTE — Progress Notes (Signed)
[employer] results reviewed. Patient treated per standing orders for Yeast. Tawny Hopping, RN

## 2019-09-09 LAB — IGP, APTIMA HPV
HPV Aptima: NEGATIVE
PAP Smear Comment: 0

## 2019-11-22 ENCOUNTER — Ambulatory Visit: Payer: Self-pay

## 2019-12-22 ENCOUNTER — Other Ambulatory Visit: Payer: Self-pay

## 2019-12-22 ENCOUNTER — Ambulatory Visit (LOCAL_COMMUNITY_HEALTH_CENTER): Payer: Self-pay

## 2019-12-22 VITALS — BP 119/80 | Ht 69.0 in | Wt 157.0 lb

## 2019-12-22 DIAGNOSIS — Z3042 Encounter for surveillance of injectable contraceptive: Secondary | ICD-10-CM

## 2019-12-22 DIAGNOSIS — Z30013 Encounter for initial prescription of injectable contraceptive: Secondary | ICD-10-CM

## 2019-12-22 DIAGNOSIS — Z3009 Encounter for other general counseling and advice on contraception: Secondary | ICD-10-CM

## 2019-12-22 NOTE — Progress Notes (Signed)
15 weeks 2 days post depo. Depo consent signed today. Depo 150mg  IM given R Deltoid per order by , CNM dated 09/06/2019. Tolerated well. Counseled to be consistent in receiving depo every 11-13 weeks. Pt in agreement, reports that d/t busy work schedule, she was unable to come in before today. Next depo due 03/09/2019, pt aware. 03/11/2019, RN

## 2020-01-17 ENCOUNTER — Other Ambulatory Visit: Payer: Self-pay

## 2020-01-17 ENCOUNTER — Emergency Department
Admission: EM | Admit: 2020-01-17 | Discharge: 2020-01-17 | Disposition: A | Discharge status: Home / Self Care | Payer: HRSA Program | Attending: Emergency Medicine | Admitting: Emergency Medicine

## 2020-01-17 DIAGNOSIS — R509 Fever, unspecified: Secondary | ICD-10-CM | POA: Diagnosis present

## 2020-01-17 DIAGNOSIS — F17210 Nicotine dependence, cigarettes, uncomplicated: Secondary | ICD-10-CM | POA: Insufficient documentation

## 2020-01-17 DIAGNOSIS — Z79899 Other long term (current) drug therapy: Secondary | ICD-10-CM | POA: Insufficient documentation

## 2020-01-17 DIAGNOSIS — J45909 Unspecified asthma, uncomplicated: Secondary | ICD-10-CM | POA: Insufficient documentation

## 2020-01-17 DIAGNOSIS — U071 COVID-19: Secondary | ICD-10-CM | POA: Insufficient documentation

## 2020-01-17 LAB — RESP PANEL BY RT-PCR (FLU A&B, COVID) ARPGX2
Influenza A by PCR: NEGATIVE
Influenza B by PCR: NEGATIVE
SARS Coronavirus 2 by RT PCR: POSITIVE — AB

## 2020-01-17 MED ORDER — ALBUTEROL SULFATE HFA 108 (90 BASE) MCG/ACT IN AERS: 2.0000 | INHALATION_SPRAY | RESPIRATORY_TRACT | 0 refills | Status: DC | PRN

## 2020-01-17 MED ORDER — ACETAMINOPHEN 325 MG PO TABS
650.0000 mg | ORAL_TABLET | Freq: Once | ORAL | Status: AC
  Administered 2020-01-17: 650 mg via ORAL
  Filled 2020-01-17: qty 2

## 2020-01-17 MED ORDER — ONDANSETRON 4 MG PO TBDP: 4.0000 mg | ORAL_TABLET | Freq: Three times a day (TID) | ORAL | 0 refills | Status: DC | PRN

## 2020-01-17 MED ORDER — PREDNISONE 50 MG PO TABS: 50.0000 mg | ORAL_TABLET | Freq: Every day | ORAL | 0 refills | Status: DC

## 2020-01-17 MED ORDER — PSEUDOEPH-BROMPHEN-DM 30-2-10 MG/5ML PO SYRP: 10.0000 mL | ORAL_SOLUTION | Freq: Four times a day (QID) | ORAL | 0 refills | Status: DC | PRN

## 2020-01-17 NOTE — ED Notes (Signed)
Pt has c/o generalized body aches, malaise and fatigue for the past few days. Pt states she has been exposed to Covid at work. Pt ambulatory, A&O x 4 and appears to be in NAD on arrival.

## 2020-01-17 NOTE — ED Provider Notes (Signed)
[facility] Emergency Department Provider Note  ____________________________________________  Time seen: Approximately 10:40 PM  I have reviewed the triage vital signs and the nursing notes.   HISTORY  Chief Complaint Generalized Body Aches    HPI Linda Hester is a 29 y.o. female who presents the emergency department complaining of fevers, chills, nasal congestion, sore throat, coughing, body aches.  Patient states that she was exposed to a coworker last week for 3 shifts that tested positive for Covid.  Patient states now her entire shift has come down with similar symptoms and she presents for evaluation for possible Covid.  Patient denies any neck pain or stiffness, chest pain.  She did have some shortness of breath today prompting her to present to emergency department but she does not feel short of breath currently.  She does have a history of asthma but states that she has not had to use any albuterol for her symptoms.  No other medications prior to arrival.         Past Medical History:  Diagnosis Date  . Anemia   . Asthma   . Head trauma    as a child, denies neurological deficits  . Herpes simplex virus (HSV) infection   . Mental disorder    Anxiety    Patient Active Problem List   Diagnosis Date Noted  . Smoker 3 cpd 09/06/2019  . Marijuana use daily 09/06/2019  . Asthma 09/06/2019  . Genital herpes 07/30/2017  . 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 NO PAST SURGERIES      Prior to Admission medications   Medication Sig Start Date End Date Taking? Authorizing Provider  albuterol (VENTOLIN HFA) 108 (90 Base) MCG/ACT inhaler Inhale 2 puffs into the lungs every 4 (four) hours as needed for wheezing or shortness of breath. 01/17/20  Yes Oaklie Durrett, Delorise Royals, PA-C  brompheniramine-pseudoephedrine-DM 30-2-10 MG/5ML syrup Take 10 mLs by mouth 4 (four) times daily as needed. 01/17/20   Yes Addasyn Mcbreen, Delorise Royals, PA-C  ondansetron (ZOFRAN-ODT) 4 MG disintegrating tablet Take 1 tablet (4 mg total) by mouth every 8 (eight) hours as needed for nausea or vomiting. 01/17/20  Yes Carrel Leather, Delorise Royals, PA-C  predniSONE (DELTASONE) 50 MG tablet Take 1 tablet (50 mg total) by mouth daily with breakfast. 01/17/20  Yes Maryse Brierley, Delorise Royals, PA-C  benzonatate (TESSALON PERLES) 100 MG capsule Take 1-2 tabs TID prn cough Patient not taking: Reported on 03/16/2019 02/05/19   Menshew, Charlesetta Ivory, PA-C  Multiple Vitamin (MULTIVITAMIN WITH MINERALS) TABS tablet Take 1 tablet by mouth daily. Gummy multivitamin    [provider]  vitamin C (ASCORBIC ACID) 250 MG tablet Take 250 mg by mouth daily. UNSURE of dose.    [provider]  ferrous sulfate 325 (65 FE) MG tablet Take 1 tablet (325 mg total) by mouth 2 (two) times daily with a meal. Patient not taking: Reported on 12/21/2018 10/04/17 02/05/19  McVey, Prudencio Pair, CNM    Allergies Shellfish allergy  Family History  Problem Relation Age of Onset  . Asthma Mother   . Fibroids Mother   . Anemia Mother   . Sickle cell trait Daughter   . Hypertension Maternal Grandmother   . Anemia Maternal Grandmother   . Diabetes Maternal Aunt     Social History Social History   Tobacco Use  . Smoking status: Current Every Day Smoker    Packs/day: 0.25    Types: Cigarettes  .  Smokeless tobacco: Never Used  . Tobacco comment: "every now and then"   Substance Use Topics  . Alcohol use: Yes    Alcohol/week: 3.0 standard drinks    Types: 3 Standard drinks or equivalent per week    Comment: 3 c. vodka q 2 months  . Drug use: Yes    Frequency: 7.0 times per week    Types: Marijuana    Comment: had + UDS 01-09-15 for MJ     Review of Systems  Constitutional: Positive fever/chills Eyes: No visual changes. No discharge ENT: Nasal congestion and sore throat Cardiovascular: no chest pain. Respiratory: Positive for cough, mild  shortness of breath Gastrointestinal: No abdominal pain.  No nausea, no vomiting.  No diarrhea.  No constipation. Musculoskeletal: Negative for musculoskeletal pain. Skin: Negative for rash, abrasions, lacerations, ecchymosis. Neurological: Negative for headaches, focal weakness or numbness.  10 System ROS otherwise negative.  ____________________________________________   PHYSICAL EXAM:  VITAL SIGNS: ED Triage Vitals  Enc Vitals Group     BP 01/17/20 1852 132/85     Pulse Rate 01/17/20 1852 96     Resp 01/17/20 1852 18     Temp 01/17/20 1852 99.9 F (37.7 C)     Temp Source 01/17/20 1852 Oral     SpO2 01/17/20 1852 98 %     Weight 01/17/20 1853 162 lb (73.5 kg)     Height 01/17/20 1853 5\' 9"  (1.753 m)     Head Circumference --      Peak Flow --      Pain Score 01/17/20 1853 8     Pain Loc --      Pain Edu? --      Excl. in GC? --      Constitutional: Alert and oriented. Well appearing and in no acute distress. Eyes: Conjunctivae are normal. PERRL. EOMI. Head: Atraumatic. ENT:      Ears:       Nose: Moderate congestion/rhinnorhea.      Mouth/Throat: Mucous membranes are moist.  Neck: No stridor.  Neck is supple with full range of motion Hematological/Lymphatic/Immunilogical: No cervical lymphadenopathy. Cardiovascular: Normal rate, regular rhythm. Normal S1 and S2.  Good peripheral circulation. Respiratory: Normal respiratory effort without tachypnea or retractions. Lungs CTAB. Good air entry to the bases with no decreased or absent breath sounds. Musculoskeletal: Full range of motion to all extremities. No gross deformities appreciated. Neurologic:  Normal speech and language. No gross focal neurologic deficits are appreciated.  Skin:  Skin is warm, dry and intact. No rash noted. Psychiatric: Mood and affect are normal. Speech and behavior are normal. Patient exhibits appropriate insight and judgement.   ____________________________________________   LABS (all  labs ordered are listed, but only abnormal results are displayed)  Labs Reviewed  RESP PANEL BY RT-PCR (FLU A&B, COVID) ARPGX2 - Abnormal; Notable for the following components:      Result Value   SARS Coronavirus 2 by RT PCR POSITIVE (*)    All other components within normal limits   ____________________________________________  EKG   ____________________________________________  RADIOLOGY   No results found.  ____________________________________________    PROCEDURES  Procedure(s) performed:    Procedures    Medications  acetaminophen (TYLENOL) tablet 650 mg (650 mg Oral Given 01/17/20 1858)     ____________________________________________   INITIAL IMPRESSION / ASSESSMENT AND PLAN / ED COURSE  Pertinent labs & imaging results that were available during my care of the patient were reviewed by me and considered in my medical  decision making (see chart for details).  Review of the [place] CSRS was performed in accordance of the [facility] prior to dispensing any controlled drugs.           Patient's diagnosis is consistent with COVID-19.  Patient presented to the emergency department complaining of symptoms consistent with COVID-19 after close exposure to a coworker who tested positive.  Findings are consistent on physical exam Covid with no increased work of breathing or adventitious lung sounds.  Patient tested positive for Covid here in the emergency department.  No indication for further work-up with additional labs or imaging.  Patient will be provided prednisone, albuterol given the fact that she does have a history of asthma and indoor shortness of breath earlier though there is no wheezing currently.  Patient will also have Bromfed cough syrup and Zofran for additional symptom control.  Tylenol and Motrin at home.  Return precautions discussed with the patient to include high fevers not responding to medicine, inability to maintain good oral hydration or increased  difficulty breathing.  Otherwise follow-up with primary care.. Patient is given ED precautions to return to the ED for any worsening or new symptoms.     ____________________________________________  FINAL CLINICAL IMPRESSION(S) / ED DIAGNOSES  Final diagnoses:  COVID-19      NEW MEDICATIONS STARTED DURING THIS VISIT:  ED Discharge Orders         Ordered    predniSONE (DELTASONE) 50 MG tablet  Daily with breakfast        01/17/20 2250    albuterol (VENTOLIN HFA) 108 (90 Base) MCG/ACT inhaler  Every 4 hours PRN        01/17/20 2250    brompheniramine-pseudoephedrine-DM 30-2-10 MG/5ML syrup  4 times daily PRN        01/17/20 2250    ondansetron (ZOFRAN-ODT) 4 MG disintegrating tablet  Every 8 hours PRN        01/17/20 2250              This chart was dictated using voice recognition software/Dragon. Despite best efforts to proofread, errors can occur which can change the meaning. Any change was purely unintentional.    Racheal Patches, PA-C 01/17/20 2253    Phineas Semen, MD 01/17/20 2300

## 2020-01-17 NOTE — ED Triage Notes (Signed)
Pt comes in with body aches, chills, cough yesterday, Tested negative yesterday but had a covid break out at work.

## 2020-04-05 ENCOUNTER — Other Ambulatory Visit: Payer: Self-pay

## 2020-04-05 ENCOUNTER — Ambulatory Visit (LOCAL_COMMUNITY_HEALTH_CENTER): Payer: Self-pay

## 2020-04-05 VITALS — BP 114/78 | Ht 69.0 in | Wt 160.0 lb

## 2020-04-05 DIAGNOSIS — Z3009 Encounter for other general counseling and advice on contraception: Secondary | ICD-10-CM

## 2020-04-05 DIAGNOSIS — Z3042 Encounter for surveillance of injectable contraceptive: Secondary | ICD-10-CM

## 2020-04-05 NOTE — Progress Notes (Signed)
15 weeks post depo. Local resource provider list given as pt explains difficulty in getting appt for asthma evaluation. Depo given today Left deltoid per order by Hazle Coca, CNM dated 09/06/2019. Tolerated well. Next depo due 06/21/2020, pt aware. Jerel Shepherd, RN

## 2020-06-28 ENCOUNTER — Other Ambulatory Visit: Payer: Self-pay

## 2020-06-28 ENCOUNTER — Ambulatory Visit (LOCAL_COMMUNITY_HEALTH_CENTER): Payer: Self-pay

## 2020-06-28 VITALS — BP 119/80 | Ht 69.0 in | Wt 155.5 lb

## 2020-06-28 DIAGNOSIS — Z3009 Encounter for other general counseling and advice on contraception: Secondary | ICD-10-CM

## 2020-06-28 DIAGNOSIS — Z3042 Encounter for surveillance of injectable contraceptive: Secondary | ICD-10-CM

## 2020-06-28 NOTE — Progress Notes (Signed)
Pt is 12.0 weeks post depo today. DMPA 150 mg IM administered per Arnetha Courser, CNM order dated 09/06/19. Pt to schedule physical when next depo is due.

## 2020-09-06 ENCOUNTER — Other Ambulatory Visit: Payer: Self-pay

## 2020-09-06 ENCOUNTER — Encounter: Payer: Self-pay | Admitting: Physician Assistant

## 2020-09-06 ENCOUNTER — Ambulatory Visit (LOCAL_COMMUNITY_HEALTH_CENTER): Payer: Self-pay | Admitting: Physician Assistant

## 2020-09-06 VITALS — BP 109/72 | HR 83 | Temp 98.8°F | Resp 16 | Ht 69.0 in | Wt 171.0 lb

## 2020-09-06 DIAGNOSIS — Z01419 Encounter for gynecological examination (general) (routine) without abnormal findings: Secondary | ICD-10-CM

## 2020-09-06 DIAGNOSIS — Z3009 Encounter for other general counseling and advice on contraception: Secondary | ICD-10-CM

## 2020-09-06 DIAGNOSIS — Z1331 Encounter for screening for depression: Secondary | ICD-10-CM | POA: Insufficient documentation

## 2020-09-06 LAB — WET PREP FOR TRICH, YEAST, CLUE
Trichomonas Exam: NEGATIVE
Yeast Exam: NEGATIVE

## 2020-09-06 LAB — HM HIV SCREENING LAB: HM HIV Screening: NEGATIVE

## 2020-09-06 LAB — HM HEPATITIS C SCREENING LAB: HM Hepatitis Screen: NEGATIVE

## 2020-09-06 MED ORDER — MEDROXYPROGESTERONE ACETATE 150 MG/ML IM SUSP
150.0000 mg | INTRAMUSCULAR | Status: AC
  Administered 2020-10-19 – 2021-07-15 (×3): 150 mg via INTRAMUSCULAR

## 2020-09-06 NOTE — Progress Notes (Signed)
Family Planning Visit- Repeat Yearly Visit  Subjective:  Linda Hester is a 30 y.o. [id number]  being seen today for an annual wellness visit and to discuss contraception options.   The patient is currently using Depo Provera for pregnancy prevention. Patient does not want a pregnancy in the next year. Patient has the following medical problems: has Genital herpes; Smoker 3 cpd; Marijuana use daily; and Asthma on their problem list.  Chief Complaint  Patient presents with   Annual Exam    Patient reports she feels well today. Occ has dizziness if she stands up, has occ asthma sx (cold trigger) and has mild HA 2-3 times a week, Advil or OTC migraine med helps. Trying to get PCP, now working on getting into [facility]. Smokes about 1.5 packs cigs/week, occ marijuana use. Works full time at [employer].  Patient denies other concerns.   See flowsheet for other program required questions.   Body mass index is 25.25 kg/m. - Patient is eligible for diabetes screening based on BMI and age >35?  no HA1C ordered? not applicable  Patient reports 1 of partners in last year. Desires STI screening?  Yes   Has patient been screened once for HCV in the past?  No  No results found for: HCVAB HCV screening done today.  Does the patient have current of drug use, have a partner with drug use, and/or has been incarcerated since last result? Yes  If yes-- Screen for HCV through [facility]   Does the patient meet criteria for HBV testing? Yes  Criteria:  -Household, sexual or needle sharing contact with HBV -History of drug use -HIV positive -Those with known Hep C   Health Maintenance Due  Topic Date Due   COVID-19 Vaccine (1) Never done   Pneumococcal Vaccine 28-39 Years old (1 - PCV) Never done   Hepatitis C Screening  Never done   INFLUENZA VACCINE  08/20/2020    Review of Systems  Constitutional: Negative.   HENT: Negative.    Eyes: Negative.   Respiratory:  Positive for shortness  of breath.   Cardiovascular: Negative.   Gastrointestinal: Negative.   Genitourinary: Negative.   Skin: Negative.   Neurological:  Positive for dizziness and headaches.  Endo/Heme/Allergies: Negative.   Psychiatric/Behavioral:  Positive for depression and substance abuse.   All other systems reviewed and are negative.  The following portions of the patient's history were reviewed and updated as appropriate: allergies, current medications, past family history, past medical history, past social history, past surgical history and problem list. Problem list updated.  Objective:   Vitals:   09/06/20 1224  BP: 109/72  Pulse: 83  Resp: 16  Temp: 98.8 F (37.1 C)  TempSrc: Oral  Weight: 171 lb (77.6 kg)  Height: 5\' 9"  (1.753 m)    Physical Exam Constitutional:      Appearance: Normal appearance.  HENT:     Head: Normocephalic and atraumatic.  Pulmonary:     Effort: Pulmonary effort is normal.  Abdominal:     Palpations: Abdomen is soft.  Genitourinary:    General: Normal vulva.     Exam position: Lithotomy position.     Pubic Area: No rash.      Labia:        Right: No lesion.        Left: No lesion.      Urethra: No prolapse.     Vagina: Vaginal discharge present. No tenderness or bleeding.  Cervix: No cervical motion tenderness, friability, lesion or erythema.     Uterus: Not enlarged and not tender.      Adnexa:        Right: No tenderness.         Left: No tenderness.       Rectum: No external hemorrhoid.     Comments: Mod amt thick white vag discharge without odor, pH <4.5. Musculoskeletal:        General: Normal range of motion.  Lymphadenopathy:     Lower Body: No right inguinal adenopathy. No left inguinal adenopathy.  Skin:    General: Skin is warm and dry.  Neurological:     General: No focal deficit present.     Mental Status: She is alert.  Psychiatric:        Mood and Affect: Mood normal.        Behavior: Behavior normal.      Assessment and  Plan:  Linda Hester is a 30 y.o. female [id number] presenting to the [facility] Department for an yearly wellness and contraception visit  Contraception counseling: Reviewed all forms of birth control options in the tiered based approach. available including abstinence; over the counter/barrier methods; hormonal contraceptive medication including pill, patch, ring, injection,contraceptive implant, ECP; hormonal and nonhormonal IUDs; permanent sterilization options including vasectomy and the various tubal sterilization modalities. Risks, benefits, and typical effectiveness rates were reviewed.  Questions were answered.  Written information was also given to the patient to review.  Patient desires DMPA, this was prescribed for patient. She will follow up in2 weeks for surveillance.  She was told to call with any further questions, or with any concerns about this method of contraception.  Emphasized use of condoms 100% of the time for STI prevention.  1. Family planning services Return in 2 weeks for next DMPA injection(only 10 weeks since last injection today), then continue DMPA 150mg  IM every 3 months for 1 year. Continue condoms for STI prevention. Enc weightbearing exercise and calcium-rich diet for bone health.  2. Well woman exam with routine gynecological exam Pap up to date. Wet prep neg. Enc to est PCP for acute/chronic care. - WET PREP FOR TRICH, YEAST, CLUE - HIV/HCV [place] [facility] - Syphilis Serology, [place] [facility] - Chlamydia/Gonorrhea [place] [facility]  3. Positive screening for depression on 2-item Patient Health Questionnaire (PHQ-2) PHQ-2 score is 1. States she feels stressed at times. PHQ-9 score is 2, no SI/HI. Offered referral to [facility] LCSW or other counselor, pt declined.   Return in about 2 weeks (around 09/20/2020) for Routine DMPA injection.  Future Appointments  Date Time Provider Department Center  09/21/2020  8:20 AM [facility] NURSE [facility] None    11/21/2020,  PA-C

## 2020-09-06 NOTE — Progress Notes (Signed)
Patient here for PE.   Too early for depo today, last depo was given on 06/28/20, 10 weeks ago. Scheduled patient to return on 09/21/20 (12w 1d post depo) for nurse visit for depo only. Depo consent signed today and depo order placed today by provider, A. Streilein, PA-C.   Wet prep reviewed. No treatment indicated.   Floy Sabina, RN

## 2020-09-21 ENCOUNTER — Ambulatory Visit: Payer: Self-pay

## 2020-10-19 ENCOUNTER — Ambulatory Visit (LOCAL_COMMUNITY_HEALTH_CENTER): Payer: Self-pay

## 2020-10-19 ENCOUNTER — Other Ambulatory Visit: Payer: Self-pay

## 2020-10-19 VITALS — BP 111/77 | Ht 69.0 in | Wt 175.0 lb

## 2020-10-19 DIAGNOSIS — Z3009 Encounter for other general counseling and advice on contraception: Secondary | ICD-10-CM

## 2020-10-19 DIAGNOSIS — Z3042 Encounter for surveillance of injectable contraceptive: Secondary | ICD-10-CM

## 2020-10-19 NOTE — Progress Notes (Signed)
DMPA 150 mg given IM (Left Deltoid) today per 09/06/2020 order by Landry Dyke, PA and tolerated well.  Patient is 16.1 weeks post last Depo. Hart Carwin, RN

## 2021-02-21 ENCOUNTER — Ambulatory Visit: Payer: Self-pay

## 2021-03-05 ENCOUNTER — Other Ambulatory Visit: Payer: Self-pay

## 2021-03-05 ENCOUNTER — Ambulatory Visit (LOCAL_COMMUNITY_HEALTH_CENTER): Payer: Self-pay | Admitting: Advanced Practice Midwife

## 2021-03-05 VITALS — BP 136/86 | Ht 69.0 in | Wt 187.4 lb

## 2021-03-05 DIAGNOSIS — Z3202 Encounter for pregnancy test, result negative: Secondary | ICD-10-CM

## 2021-03-05 DIAGNOSIS — Z3042 Encounter for surveillance of injectable contraceptive: Secondary | ICD-10-CM

## 2021-03-05 DIAGNOSIS — Z3009 Encounter for other general counseling and advice on contraception: Secondary | ICD-10-CM

## 2021-03-05 DIAGNOSIS — F17200 Nicotine dependence, unspecified, uncomplicated: Secondary | ICD-10-CM

## 2021-03-05 LAB — PREGNANCY, URINE: Preg Test, Ur: NEGATIVE

## 2021-03-05 NOTE — Progress Notes (Signed)
PT negative. Depo given per verbal order of E. Sciora, CNM as well as 09/06/2020 written order of A. Streilein, PA-C. Patient tolerated well. Tawny Hopping, RN

## 2021-03-05 NOTE — Progress Notes (Signed)
[facility] [address] Main Number: [phone]  Contraception/Family Planning VISIT ENCOUNTER NOTE  Subjective:   Linda Hester is a 31 y.o. SBF [id number] female here for reproductive life counseling. The patient is currently using No Method - Other Reason to prevent pregnancy.  Desires DMPA restart for BCM.  The patient does not want a pregnancy in the next year.  No menses while on DMPA Last sex 02/12/21 without condom; with current partner x 6 years; 1 sex partner in last 3 mo. Last DMPA 10/19/20 (19.4). Last PE 09/06/20. Last pap 09/06/19 neg HPV neg.    Denies abnormal vaginal bleeding, discharge, pelvic pain, problems with intercourse or other gynecologic concerns.    Gynecologic History No LMP recorded. Patient has had an injection.  Health Maintenance Due  Topic Date Due   COVID-19 Vaccine (1) Never done   INFLUENZA VACCINE  Never done     The following portions of the patient's history were reviewed and updated as appropriate: allergies, current medications, past family history, past medical history, past social history, past surgical history and problem list.  Review of Systems Pertinent items are noted in HPI.   Objective:  BP 136/86    Ht 5\' 9"  (1.753 m)    Wt 187 lb 6.4 oz (85 kg)    BMI 27.67 kg/m  Gen: well appearing, NAD HEENT: no scleral icterus CV: RR Lung: Normal WOB Ext: warm well perfused    Assessment and Plan:   Contraception counseling: Reviewed all forms of birth control options in the tiered based approach. available including abstinence; over the counter/barrier methods; hormonal contraceptive medication including pill, patch, ring, injection,contraceptive implant, ECP; hormonal and nonhormonal IUDs; permanent sterilization options including vasectomy and the various tubal sterilization modalities. Risks, benefits, and typical effectiveness rates were reviewed.  Questions were answered.   Written information was also given to the patient to review.  Patient desires DMPA restart, this was prescribed for patient. She will follow up in  11-13 wks for surveillance.  She was told to call with any further questions, or with any concerns about this method of contraception.  Emphasized use of condoms 100% of the time for STI prevention.  Patient was not  offered ECP due to not meeting criteria. ECP was not accepted by the patient. ECP counseling was not given - see RN documentation  1. Family planning  - Pregnancy, urine  2. Smoker 3 cpd Counseled via 5 A's to stop   3. Encounter for surveillance of injectable contraceptive If PT neg today may have DMPA 150 mg IM Counseled abstinance next 7 days    Please refer to After Visit Summary for other counseling recommendations.   No follow-ups on file.  , CNM [facility] DEPARTMENT

## 2021-03-05 NOTE — Progress Notes (Signed)
Here today for late Depo. Last PE and Pap Smear (NIL, HPV Neg) here was 09/06/2020. Last Depo was 10/19/2020 (19.4 weeks.) Tawny Hopping, RN

## 2021-06-24 ENCOUNTER — Ambulatory Visit: Payer: Medicaid Other

## 2021-07-08 ENCOUNTER — Ambulatory Visit: Payer: Medicaid Other

## 2021-07-15 ENCOUNTER — Ambulatory Visit (LOCAL_COMMUNITY_HEALTH_CENTER): Payer: Medicaid Other | Admitting: Nurse Practitioner

## 2021-07-15 ENCOUNTER — Encounter: Payer: Self-pay | Admitting: Nurse Practitioner

## 2021-07-15 VITALS — BP 107/66 | Ht 69.0 in | Wt 172.0 lb

## 2021-07-15 DIAGNOSIS — Z3200 Encounter for pregnancy test, result unknown: Secondary | ICD-10-CM

## 2021-07-15 DIAGNOSIS — Z3042 Encounter for surveillance of injectable contraceptive: Secondary | ICD-10-CM

## 2021-07-15 DIAGNOSIS — Z3009 Encounter for other general counseling and advice on contraception: Secondary | ICD-10-CM | POA: Diagnosis not present

## 2021-07-15 DIAGNOSIS — Z3202 Encounter for pregnancy test, result negative: Secondary | ICD-10-CM

## 2021-07-15 LAB — PREGNANCY, URINE: Preg Test, Ur: NEGATIVE

## 2021-07-15 NOTE — Progress Notes (Signed)
31 year old female in clinic for a Depo.  Patient reports last depo 03/05/21 (18w6).  Patient sent for a PT.  PT negative.  Discussed with patient the importance of receiving Depo between 11-13 weeks.  Patient may have Depo per 09/06/20.  RP due 08/2021. Glenna Fellows, FNP

## 2021-09-21 ENCOUNTER — Emergency Department: Payer: Medicaid Other

## 2021-09-21 ENCOUNTER — Other Ambulatory Visit: Payer: Self-pay

## 2021-09-21 ENCOUNTER — Emergency Department
Admission: EM | Admit: 2021-09-21 | Discharge: 2021-09-21 | Disposition: A | Discharge status: Home / Self Care | Payer: Medicaid Other | Attending: Emergency Medicine | Admitting: Emergency Medicine

## 2021-09-21 DIAGNOSIS — S62339A Displaced fracture of neck of unspecified metacarpal bone, initial encounter for closed fracture: Secondary | ICD-10-CM

## 2021-09-21 DIAGNOSIS — S62366A Nondisplaced fracture of neck of fifth metacarpal bone, right hand, initial encounter for closed fracture: Secondary | ICD-10-CM | POA: Insufficient documentation

## 2021-09-21 DIAGNOSIS — W228XXA Striking against or struck by other objects, initial encounter: Secondary | ICD-10-CM | POA: Insufficient documentation

## 2021-09-21 DIAGNOSIS — S6991XA Unspecified injury of right wrist, hand and finger(s), initial encounter: Secondary | ICD-10-CM | POA: Diagnosis present

## 2021-09-21 MED ORDER — ACETAMINOPHEN 325 MG PO TABS
650.0000 mg | ORAL_TABLET | Freq: Once | ORAL | Status: AC
  Administered 2021-09-21: 650 mg via ORAL
  Filled 2021-09-21: qty 2

## 2021-09-21 MED ORDER — IBUPROFEN 400 MG PO TABS
400.0000 mg | ORAL_TABLET | Freq: Once | ORAL | Status: AC
  Administered 2021-09-21: 400 mg via ORAL
  Filled 2021-09-21: qty 1

## 2021-09-21 NOTE — Discharge Instructions (Signed)
Call Dr Okey Dupre for an appointment to check your broken metacarpal hand bone.   Keep your hand in the splint until your appointment.   Take acetaminophen 650 mg and ibuprofen 400 mg every 6 hours for pain.  Take with food.   Thank you for choosing [place] for your health care today!  Please see your primary doctor this week for a follow up appointment.   Sometimes, in the early stages of certain disease courses it is difficult to detect in the emergency department evaluation -- so, it is important that you continue to monitor your symptoms and call your doctor right away or return to the emergency department if you develop any new or worsening symptoms.  It was my pleasure to care for you today.   Daneil Dan Modesto Charon, MD

## 2021-09-21 NOTE — ED Notes (Signed)
Pt to be d/c post splint application.

## 2021-09-21 NOTE — ED Triage Notes (Signed)
Pt presents to ED with c/ of R wrist pain. Pt state she punched the fridge last night. R radial pulse palpable.

## 2021-09-21 NOTE — ED Notes (Signed)
Provider d/c pt so missing pt signature.

## 2021-09-21 NOTE — ED Provider Notes (Signed)
[facility] Provider Note    Event Date/Time   First MD Initiated Contact with Patient 09/21/21 1119     (approximate)   History   Wrist Pain   HPI  Anyelin Kurtzman is a 31 y.o. female   Pleasant woman with no significant past medical history presents with an injury to her right hand after punching a refrigerator last night when she was frustrated.  No other injuries.  She has pain mostly in the ulnar side of the right mid hand.    History was obtained via patient      Physical Exam   Triage Vital Signs: ED Triage Vitals  Enc Vitals Group     BP 09/21/21 1020 (!) 127/90     Pulse Rate 09/21/21 1020 95     Resp 09/21/21 1020 14     Temp 09/21/21 1020 98.2 F (36.8 C)     Temp Source 09/21/21 1020 Oral     SpO2 09/21/21 1020 100 %     Weight 09/21/21 1021 177 lb (80.3 kg)     Height 09/21/21 1021 5\' 9"  (1.753 m)     Head Circumference --      Peak Flow --      Pain Score 09/21/21 1027 8     Pain Loc --      Pain Edu? --      Excl. in GC? --     Most recent vital signs: Vitals:   09/21/21 1020  BP: (!) 127/90  Pulse: 95  Resp: 14  Temp: 98.2 F (36.8 C)  SpO2: 100%    General: Awake, no distress.  CV:  Good peripheral perfusion.  Resp:  Normal effort.  Abd:  No distention.  Other:  Tenderness to palpation of the fourth and fifth metacarpal, able to fully range at the wrist and digits.  No snuffbox tenderness, no wrist tenderness.   ED Results / Procedures / Treatments  RADIOLOGY I independently reviewed and interpreted x-ray of the wrist and see no obvious fracture or dislocation.    PROCEDURES:  Critical Care performed: No  Procedures   MEDICATIONS ORDERED IN ED: Medications  acetaminophen (TYLENOL) tablet 650 mg (650 mg Oral Given 09/21/21 1219)  ibuprofen (ADVIL) tablet 400 mg (400 mg Oral Given 09/21/21 1220)    IMPRESSION / MDM / ASSESSMENT AND PLAN / ED COURSE  I reviewed the triage vital signs and the nursing  notes.                              Differential diagnosis includes, but is not limited to, carpal fracture, boxer's fracture, wrist fracture, soft tissue injury sitter but less likely compartment syndrome, neurovascular injuries.  MDM: Patient with tenderness to palpation to the metacarpal fourth and fifth digits assess for boxer's fracture, but with significant pain, will splint and have her follow-up in Ortho whether the x-rays are positive or negative.  Doubt tendon injury, neurovascular compromise, compartment syndrome given rest of exam is benign no other injuries noted.   Stray of the hand shows a subtle fracture on the fifth metacarpal base, given ulnar gutter splint and Ortho follow-up.  Sitter at admission or observation but given no signs of emergent pathology such as compartment syndrome, neurovascular deficits, patient is safe for discharge at this time.  Patient's presentation is most consistent with acute complicated illness / injury requiring diagnostic workup.       FINAL  CLINICAL IMPRESSION(S) / ED DIAGNOSES   Final diagnoses:  Closed boxer's fracture, initial encounter     Rx / DC Orders   ED Discharge Orders     None        Note:  This document was prepared using Dragon voice recognition software and may include unintentional dictation errors.    Pilar Jarvis, MD 09/21/21 [phone]

## 2021-09-27 ENCOUNTER — Other Ambulatory Visit: Payer: Medicaid Other

## 2021-10-02 ENCOUNTER — Ambulatory Visit (LOCAL_COMMUNITY_HEALTH_CENTER): Payer: Medicaid Other

## 2021-10-02 VITALS — BP 125/76 | Ht 69.0 in | Wt 176.0 lb

## 2021-10-02 DIAGNOSIS — Z309 Encounter for contraceptive management, unspecified: Secondary | ICD-10-CM | POA: Diagnosis not present

## 2021-10-02 DIAGNOSIS — Z3009 Encounter for other general counseling and advice on contraception: Secondary | ICD-10-CM

## 2021-10-02 DIAGNOSIS — Z3042 Encounter for surveillance of injectable contraceptive: Secondary | ICD-10-CM | POA: Diagnosis not present

## 2021-10-02 MED ORDER — MEDROXYPROGESTERONE ACETATE 150 MG/ML IM SUSP
150.0000 mg | Freq: Once | INTRAMUSCULAR | Status: AC
  Administered 2021-10-02: 150 mg via INTRAMUSCULAR

## 2021-10-02 NOTE — Progress Notes (Signed)
11 weeks 2 days post depo. Voices no concerns. Pt is due for annual PE and advised to make appt. Last PE 09/06/2020.  Depo consent signed today.  Depo given today per SO Dr Karyl Kinnier. Tolerated well R Delt. Next depo due 12/18/2021. Reminder for annual PE and depo given. Jerel Shepherd, RN

## 2021-11-28 ENCOUNTER — Ambulatory Visit: Payer: Medicaid Other

## 2021-12-18 ENCOUNTER — Ambulatory Visit: Payer: Medicaid Other

## 2022-01-06 ENCOUNTER — Telehealth: Payer: Self-pay | Admitting: Family Medicine

## 2022-01-07 ENCOUNTER — Ambulatory Visit: Payer: Medicaid Other | Admitting: Family Medicine

## 2022-01-07 ENCOUNTER — Ambulatory Visit: Payer: Medicaid Other

## 2022-01-07 VITALS — BP 117/80 | Ht 69.0 in | Wt 182.4 lb

## 2022-01-07 DIAGNOSIS — Z309 Encounter for contraceptive management, unspecified: Secondary | ICD-10-CM

## 2022-01-07 DIAGNOSIS — Z30013 Encounter for initial prescription of injectable contraceptive: Secondary | ICD-10-CM

## 2022-01-07 DIAGNOSIS — Z3042 Encounter for surveillance of injectable contraceptive: Secondary | ICD-10-CM

## 2022-01-07 MED ORDER — MEDROXYPROGESTERONE ACETATE 150 MG/ML IM SUSP
150.0000 mg | INTRAMUSCULAR | Status: AC
Start: 2022-01-07 — End: 2023-01-12
  Administered 2022-01-07 – 2023-01-12 (×5): 150 mg via INTRAMUSCULAR

## 2022-01-07 NOTE — Progress Notes (Signed)
  31 y.o. Black female presents to clinic today for renewal of Depo Provera order and due for Depo injection administration.  Patient has appointment scheduled in January for PE, last PE 09/06/2020, last PAP 09/06/2019 result NILM/negative.  Reports frequent asthma exacerbations, has not used an inhaler in 2 years due to access to primary care. Given PCP resource list.  Offered condoms for all sex, patient declined.   1. Encounter for surveillance of injectable contraceptive  - medroxyPROGESTERone (DEPO-PROVERA) injection 150 mg  Colvin Caroli, FNP-C

## 2022-01-07 NOTE — Progress Notes (Signed)
Pt. Received depo IM injection in Left deltoid, per House FNP. Pt. declined STI screening.

## 2022-01-22 ENCOUNTER — Ambulatory Visit: Payer: Medicaid Other

## 2022-01-28 ENCOUNTER — Other Ambulatory Visit: Payer: Self-pay

## 2022-01-28 ENCOUNTER — Emergency Department
Admission: EM | Admit: 2022-01-28 | Discharge: 2022-01-28 | Disposition: A | Discharge status: Home / Self Care | Payer: Medicaid Other | Attending: Emergency Medicine | Admitting: Emergency Medicine

## 2022-01-28 ENCOUNTER — Emergency Department: Payer: Medicaid Other

## 2022-01-28 DIAGNOSIS — Z1152 Encounter for screening for COVID-19: Secondary | ICD-10-CM | POA: Insufficient documentation

## 2022-01-28 DIAGNOSIS — R0981 Nasal congestion: Secondary | ICD-10-CM | POA: Diagnosis present

## 2022-01-28 DIAGNOSIS — J069 Acute upper respiratory infection, unspecified: Secondary | ICD-10-CM | POA: Diagnosis not present

## 2022-01-28 LAB — RESP PANEL BY RT-PCR (RSV, FLU A&B, COVID)  RVPGX2
Influenza A by PCR: NEGATIVE
Influenza B by PCR: NEGATIVE
Resp Syncytial Virus by PCR: NEGATIVE
SARS Coronavirus 2 by RT PCR: NEGATIVE

## 2022-01-28 LAB — GROUP A STREP BY PCR: Group A Strep by PCR: NOT DETECTED

## 2022-01-28 NOTE — ED Provider Notes (Signed)
[facility] Provider Note    Event Date/Time   First MD Initiated Contact with Patient 01/28/22 1114     (approximate)   History   Nasal Congestion   HPI  Linda Hester is a 32 y.o. female   Past medical history of otherwise healthy young woman who presents emergency department with viral URI symptoms.  4 days of nasal congestion, sore throat, myalgias, headache and mild dry cough.  Mother with same symptoms diagnosed with strep pharyngitis as well as COVID.  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     Denies shortness of breath or chest pain.  No abdominal symptoms.  No GU symptoms.  History was obtained via the patient.      Physical Exam   Triage Vital Signs: ED Triage Vitals  Enc Vitals Group     BP 01/28/22 0932 124/82     Pulse Rate 01/28/22 0932 (!) 110     Resp 01/28/22 0932 16     Temp 01/28/22 0932 99 F (37.2 C)     Temp Source 01/28/22 0932 Oral     SpO2 01/28/22 0932 99 %     Weight 01/28/22 0932 182 lb (82.6 kg)     Height 01/28/22 0932 5\' 9"  (1.753 m)     Head Circumference --      Peak Flow --      Pain Score 01/28/22 0939 4     Pain Loc --      Pain Edu? --      Excl. in [place]? --     Most recent vital signs: Vitals:   01/28/22 0932  BP: 124/82  Pulse: (!) 110  Resp: 16  Temp: 99 F (37.2 C)  SpO2: 99%    General: Awake, no distress.  CV:  Good peripheral perfusion.  Resp:  Normal effort.  Abd:  No distention.  Other:  Awake alert nontoxic-appearing neck supple with full range of motion, lungs clear without wheezing or focalities, abdomen soft and nontender skin appears warm and well-perfused, posterior oropharynx without lesions or exudates or masses.   ED Results / Procedures / Treatments   Labs (all labs ordered are listed, but only abnormal results are displayed) Labs Reviewed  RESP PANEL BY RT-PCR (RSV, FLU A&B, COVID)  RVPGX2  GROUP  A STREP BY PCR     I reviewed labs and they are notable for neg strep and respiratory viral panel.     RADIOLOGY I independently reviewed and interpreted chest x-ray see no obvious focalities or pneumothorax   PROCEDURES:  Critical Care performed: No  Procedures   MEDICATIONS ORDERED IN ED: Medications - No data to display .   IMPRESSION / MDM / ASSESSMENT AND PLAN / ED COURSE  I reviewed the triage vital signs and the nursing notes.                              Differential diagnosis includes, but is not limited to, viral URI, bacterial pneumonia, sepsis meningitis deep space neck infection or abscess    MDM: Patient with viral URI symptoms without signs of emergent bacterial illness negative testing for viral etiologies, most consistent with viral URI.  Otherwise nontoxic appears well, no indication for antibiotics or hospitalization at this time.  Anticipatory guidance she will follow-up with PMD and return with any worsening.   Patient's presentation is most  consistent with acute presentation with potential threat to life or bodily function.       FINAL CLINICAL IMPRESSION(S) / ED DIAGNOSES   Final diagnoses:  Upper respiratory tract infection, unspecified type     Rx / DC Orders   ED Discharge Orders     None        Note:  This document was prepared using Dragon voice recognition software and may include unintentional dictation errors.    Pilar Jarvis, MD 01/28/22 [phone]

## 2022-01-28 NOTE — ED Triage Notes (Signed)
Pt presents to ED with c/o f flu like s/s . 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

## 2022-01-28 NOTE — Discharge Instructions (Signed)
Take acetaminophen 650 mg and ibuprofen 400 mg every 6 hours for pain.  Take with food.  Thank you for choosing us for your health care today!  Please see your primary doctor this week for a follow up appointment.   Sometimes, in the early stages of certain disease courses it is difficult to detect in the emergency department evaluation -- so, it is important that you continue to monitor your symptoms and call your doctor right away or return to the emergency department if you develop any new or worsening symptoms.  Please go to the following website to schedule new (and existing) patient appointments:   [email or web address]  If you do not have a primary doctor try calling the following clinics to establish care:  If you have insurance:  [facility] [phone] [address] [place] [address]   [facility]  [phone] [address] [place] [address] [place] [address]   If you do not have insurance:  [facility]  [phone] [address] [place] [address]   The following is another list of primary care offices in the area who are accepting new patients at this time.  Please reach out to one of them directly and let them know you would like to schedule an appointment to follow up on an Emergency Department visit, and/or to establish a new primary care provider (PCP).  There are likely other primary care clinics in the are who are accepting new patients, but this is an excellent place to start:  [facility] Lead physician: Dr Angela Bacigalupo [address] [place], [place] [address] [phone]  [facility] Lead Physician: Dr Krichna Sowles [address], , [place] [address] [phone]  [facility]  Lead Physician: Dr Megan Johnson [address], [place], [place] [address] [phone]  [facility] Lead Physician: Dr Alex  Karamalegos [address] [place] [address] [phone]  [facility] [place] Lead Physician: Dr Laura Berglund [address], [place], [place] [address] [phone]   It was my pleasure to care for you today.   Rosamae Rocque S. Laker Thompson, MD  

## 2022-03-26 ENCOUNTER — Encounter: Payer: Self-pay | Admitting: Emergency Medicine

## 2022-03-26 ENCOUNTER — Other Ambulatory Visit: Payer: Self-pay

## 2022-03-26 ENCOUNTER — Emergency Department
Admission: EM | Admit: 2022-03-26 | Discharge: 2022-03-26 | Disposition: A | Discharge status: Home / Self Care | Payer: Medicaid Other | Attending: Emergency Medicine | Admitting: Emergency Medicine

## 2022-03-26 DIAGNOSIS — J45909 Unspecified asthma, uncomplicated: Secondary | ICD-10-CM | POA: Diagnosis not present

## 2022-03-26 DIAGNOSIS — N939 Abnormal uterine and vaginal bleeding, unspecified: Secondary | ICD-10-CM | POA: Insufficient documentation

## 2022-03-26 DIAGNOSIS — R1031 Right lower quadrant pain: Secondary | ICD-10-CM | POA: Insufficient documentation

## 2022-03-26 DIAGNOSIS — Z87891 Personal history of nicotine dependence: Secondary | ICD-10-CM | POA: Diagnosis not present

## 2022-03-26 DIAGNOSIS — R110 Nausea: Secondary | ICD-10-CM | POA: Insufficient documentation

## 2022-03-26 DIAGNOSIS — R1032 Left lower quadrant pain: Secondary | ICD-10-CM | POA: Insufficient documentation

## 2022-03-26 LAB — CBC
HCT: 40.9 % (ref 36.0–46.0)
Hemoglobin: 13.1 g/dL (ref 12.0–15.0)
MCH: 27.9 pg (ref 26.0–34.0)
MCHC: 32 g/dL (ref 30.0–36.0)
MCV: 87.2 fL (ref 80.0–100.0)
Platelets: 227 10*3/uL (ref 150–400)
RBC: 4.69 MIL/uL (ref 3.87–5.11)
RDW: 13.3 % (ref 11.5–15.5)
WBC: 4.3 10*3/uL (ref 4.0–10.5)
nRBC: 0 % (ref 0.0–0.2)

## 2022-03-26 LAB — WET PREP, GENITAL
Clue Cells Wet Prep HPF POC: NONE SEEN
Sperm: NONE SEEN
Trich, Wet Prep: NONE SEEN
WBC, Wet Prep HPF POC: <10 (ref <10)
Yeast Wet Prep HPF POC: NONE SEEN

## 2022-03-26 LAB — CHLAMYDIA/NGC RT PCR (ARMC ONLY)
Chlamydia Tr: NOT DETECTED
N gonorrhoeae: NOT DETECTED

## 2022-03-26 LAB — POC URINE PREG, ED: Preg Test, Ur: NEGATIVE

## 2022-03-26 NOTE — ED Triage Notes (Signed)
Patient to ED for vaginal bleeding- states spotting since Friday with intermittent lower abd cramping. On Depo and states she shouldn't have a period, last one 4 years ago. Having nausea and not wanting to eat with intermittent dizziness. Wearing 2-3 pads a day, blood dark brown in color.

## 2022-03-26 NOTE — Discharge Instructions (Addendum)
Your pregnancy test was negative.  We sent tests for gonorrhea and committee a.  You will receive a phone call if these results are positive.  Please follow-up with gynecology.  If you continue to have irregular bleeding you may need to have an ultrasound of your pelvis.

## 2022-03-26 NOTE — ED Provider Notes (Signed)
[facility] Provider Note    Event Date/Time   First MD Initiated Contact with Patient 03/26/22 1120     (approximate)   History   Vaginal Bleeding   HPI  Linda Hester is a 32 y.o. female who presents with vaginal bleeding.  Patient is on Depo and has been for the last 4 years.  Has not had any bleeding until this past Friday, 4 days ago.  She has had intermittent cramping comes and goes is in the bilateral lower quadrants associate with vaginal bleeding.  Vaginal bleeding is also been coming and going it will be steady and then stopped and currently she is just having some spotting.  Has had some dark discharge as well.  Denies fevers or chills.  She does feel lightheaded and emotional has had some nausea but no vomiting.  She is sexually active denies any dyspareunia.     Past Medical History:  Diagnosis Date   Anemia    Asthma    Fetal growth restriction 02/21/2015   Genital herpes    Head trauma    as a child, denies neurological deficits   Mental disorder    Anxiety    Patient Active Problem List   Diagnosis Date Noted   Positive screening for depression on 2-item Patient Health Questionnaire (PHQ-2) 09/06/2020   Smoker 3 cpd 09/06/2019   Marijuana use daily 09/06/2019   Asthma 09/06/2019   Genital herpes 07/30/2017     Physical Exam  Triage Vital Signs: ED Triage Vitals  Enc Vitals Group     BP 03/26/22 0947 107/82     Pulse Rate 03/26/22 0947 (!) 102     Resp 03/26/22 0947 18     Temp 03/26/22 0947 98.3 F (36.8 C)     Temp Source 03/26/22 0947 Oral     SpO2 03/26/22 0947 99 %     Weight --      Height --      Head Circumference --      Peak Flow --      Pain Score 03/26/22 0946 5     Pain Loc --      Pain Edu? --      Excl. in [place]? --     Most recent vital signs: Vitals:   03/26/22 0947  BP: 107/82  Pulse: (!) 102  Resp: 18  Temp: 98.3 F (36.8 C)  SpO2: 99%     General: Awake, no distress.  CV:  Good  peripheral perfusion.  Resp:  Normal effort.  Abd:  No distention.  Abdomen is soft and nontender throughout Neuro:             Awake, Alert, Oriented x 3  Other:  Scant brown discharge on pelvic exam, no active bleeding, no cervical motion tenderness   ED Results / Procedures / Treatments  Labs (all labs ordered are listed, but only abnormal results are displayed) Labs Reviewed  CHLAMYDIA/NGC RT PCR ([facility] ONLY)            WET PREP, GENITAL  CBC  POC URINE PREG, ED     EKG     RADIOLOGY    PROCEDURES:  Critical Care performed: No  Procedures   MEDICATIONS ORDERED IN ED: Medications - No data to display   IMPRESSION / MDM / [place] / ED COURSE  I reviewed the triage vital signs and the nursing notes.  Patient's presentation is most consistent with acute complicated illness / injury requiring diagnostic workup.  Differential diagnosis includes, but is not limited to, pregnancy, breakthrough bleeding, uterine fibroid, uterine polyp, cervicitis, PID  Patient is a 32 year old female who presents with irregular vaginal bleeding and cramping.  She is on Depo has not had a period in 4 years started having some intermittent spotting and cramping in the lower pelvis/abdomen over the last 3 to 4 days.  She has not had fevers chills no urinary symptoms.  Does feel more emotional and has had some nausea but no vomiting.  Patient's vital signs overall reassuring she looks well abdominal exam is benign she is not having any pain currently.  I did perform pelvic exam which shows some scant brown discharge but no active bleeding.  No cervical motion tenderness.  Clinically not concern for PID.  She is sexually active has had recent STD testing but given this new bleeding we will send GC chlamydia and wet prep to screen for cervicitis.  Will not treat empirically.  Her hemoglobin is normal and she is not describing significant volume of bleeding so  not concerned about blood loss.  Pregnancy test is negative.  Unclear why she is having breakthrough bleeding at this time may benefit from pelvic ultrasound as an outpatient if this continues but I do not feel that she needs to have this done today.  Patient will be discharged will give name of gynecologist she can follow-up with.     FINAL CLINICAL IMPRESSION(S) / ED DIAGNOSES   Final diagnoses:  Vaginal bleeding     Rx / DC Orders   ED Discharge Orders     None        Note:  This document was prepared using Dragon voice recognition software and may include unintentional dictation errors.   Rada Hay, MD 03/26/22 [phone]

## 2022-04-23 ENCOUNTER — Encounter: Payer: Self-pay | Admitting: Family

## 2022-04-23 ENCOUNTER — Ambulatory Visit (LOCAL_COMMUNITY_HEALTH_CENTER): Payer: Medicaid Other | Admitting: Family

## 2022-04-23 VITALS — BP 127/83 | HR 94 | Ht 69.0 in | Wt 176.8 lb

## 2022-04-23 DIAGNOSIS — Z309 Encounter for contraceptive management, unspecified: Secondary | ICD-10-CM

## 2022-04-23 DIAGNOSIS — Z30013 Encounter for initial prescription of injectable contraceptive: Secondary | ICD-10-CM | POA: Diagnosis not present

## 2022-04-23 DIAGNOSIS — Z3042 Encounter for surveillance of injectable contraceptive: Secondary | ICD-10-CM

## 2022-04-23 DIAGNOSIS — Z01419 Encounter for gynecological examination (general) (routine) without abnormal findings: Secondary | ICD-10-CM

## 2022-04-23 NOTE — Progress Notes (Signed)
Pt appointment for PE and Depo. Seen by [place]. Depo administered per order. Family planning packet given and contents reviewed.

## 2022-04-27 ENCOUNTER — Encounter: Payer: Self-pay | Admitting: Family

## 2022-04-27 NOTE — Progress Notes (Signed)
[facility] DEPARTMENT [facility] [address] Main Number: [phone]  Family Planning Visit- Repeat Yearly Visit  Subjective:  Linda Hester is a 32 y.o. [phone]  being seen today for an annual wellness visit and to discuss contraception options.   The patient is currently using Hormonal Injection for pregnancy prevention. Patient does not want a pregnancy in the next year.    report they are looking for a method that provides Minimal bleeding/improved bleeding profile   Patient has the following medical problems: has Genital herpes; Smoker 3 cpd; Marijuana use daily; Asthma; and Positive screening for depression on 2-item Patient Health Questionnaire (PHQ-2) on their problem list.  Chief Complaint  Patient presents with   Annual Exam    PE and STD screening    Patient reports here for physical examination and continuation of Depo injection. Last shot given 01/07/2022, 15 weeks ago. Declines STI testing, which was done recently when she went to Urgent Care for abdominal pain.   See flowsheet for other program required questions.   Body mass index is 26.11 kg/m. - Patient is eligible for diabetes screening based on BMI> 25 and age >35?  no HA1C ordered? no  Patient reports 1 of partners in last year. Desires STI screening?  no   Has patient been screened once for HCV in the past?  No  No results found for: "HCVAB"  Does the patient have current of drug use, have a partner with drug use, and/or has been incarcerated since last result? No  If yes-- Screen for HCV through [facility]   Does the patient meet criteria for HBV testing? No  Criteria:  -Household, sexual or needle sharing contact with HBV -History of drug use -HIV positive -Those with known Hep C   Health Maintenance Due  Topic Date Due   COVID-19 Vaccine (1) Never done    Review of Systems  All other systems reviewed and are negative.   The following portions  of the patient's history were reviewed and updated as appropriate: allergies, current medications, past family history, past medical history, past social history, past surgical history and problem list. Problem list updated.  Objective:   Vitals:   04/23/22 1419  BP: 127/83  Pulse: 94  Weight: 176 lb 12.8 oz (80.2 kg)  Height: 5\' 9"  (1.753 m)    Physical Exam Vitals and nursing note reviewed.  Constitutional:      Appearance: Normal appearance.  Cardiovascular:     Rate and Rhythm: Normal rate and regular rhythm.     Pulses: Normal pulses.     Heart sounds: Normal heart sounds.  Pulmonary:     Effort: Pulmonary effort is normal.     Breath sounds: Normal breath sounds.  Genitourinary:    Comments: Genital exam deferred- no concerns today Skin:    General: Skin is warm and dry.  Neurological:     Mental Status: She is alert and oriented to person, place, and time.    Assessment and Plan:  Linda Hester is a 32 y.o. female [phone] presenting to the [facility] Department for an yearly wellness and contraception visit   Contraception counseling: Reviewed options based on patient desire and reproductive life plan. Patient is interested in Hormonal Injection. This was provided to the patient today.   Risks, benefits, and typical effectiveness rates were reviewed.  Questions were answered.  Written information was also given to the patient to review.    The patient  will follow up in  3 months for surveillance.  The patient was told to call with any further questions, or with any concerns about this method of contraception.  Emphasized use of condoms 100% of the time for STI prevention.  Patient was assessed for need for ECP, not indicated.   1. Well woman exam Brief PE done today CPE with breast exam due 08/2023 Pap smear due 08/2024 STI testing declined  PHQ9=8, requests LCSW referral   2. Encounter for surveillance of injectable contraceptive Depo Provera  150mg  IM q11-13 weeks, #1, 4 refills Depo injection today, on time   Return in about 3 months (around 07/23/2022) for Depo injection.  No future appointments.  Jerrell Belfast, FNP

## 2022-06-23 ENCOUNTER — Other Ambulatory Visit: Payer: Self-pay

## 2022-06-23 ENCOUNTER — Emergency Department: Payer: Medicaid Other

## 2022-06-23 ENCOUNTER — Emergency Department
Admission: EM | Admit: 2022-06-23 | Discharge: 2022-06-23 | Disposition: A | Discharge status: Home / Self Care | Payer: Medicaid Other | Attending: Emergency Medicine | Admitting: Emergency Medicine

## 2022-06-23 DIAGNOSIS — M25562 Pain in left knee: Secondary | ICD-10-CM | POA: Insufficient documentation

## 2022-06-23 DIAGNOSIS — Z87891 Personal history of nicotine dependence: Secondary | ICD-10-CM | POA: Diagnosis not present

## 2022-06-23 DIAGNOSIS — J45909 Unspecified asthma, uncomplicated: Secondary | ICD-10-CM | POA: Insufficient documentation

## 2022-06-23 NOTE — ED Triage Notes (Signed)
C/O L knee pain, ? Popped knee out last Thursday. C/O pain with ROM and walking.

## 2022-06-23 NOTE — Discharge Instructions (Signed)
Your x-ray was negative.  It is quite possible that you may have injured your meniscus.  Please follow-up with orthopedic surgery.  Please return for any new, worsening, or change in symptoms or other concerns.  It was a pleasure caring for you today.

## 2022-06-23 NOTE — ED Provider Notes (Signed)
[facility] Provider Note    Event Date/Time   First MD Initiated Contact with Patient 06/23/22 1453     (approximate)   History   Knee Injury   HPI  Linda Hester is a 32 y.o. female who presents today for evaluation of left-sided knee pain.  Patient reports that 2 days ago she was sitting with her knee bent in a crosslegged position when she went to stand up and felt a pop on the inner part of her knee.  She reports that the pain has persisted since that time.  She has not had any numbness or tingling or weakness.  She still reports a popping and a click in that area.  She has not had any swelling or redness to the area.  Patient Active Problem List   Diagnosis Date Noted   Positive screening for depression on 2-item Patient Health Questionnaire (PHQ-2) 09/06/2020   Smoker 3 cpd 09/06/2019   Marijuana use daily 09/06/2019   Asthma 09/06/2019   Genital herpes 07/30/2017          Physical Exam   Triage Vital Signs: ED Triage Vitals  Enc Vitals Group     BP --      Pulse --      Resp --      Temp --      Temp src --      SpO2 --      Weight 06/23/22 1447 176 lb (79.8 kg)     Height 06/23/22 1447 5\' 9"  (1.753 m)     Head Circumference --      Peak Flow --      Pain Score 06/23/22 1446 0     Pain Loc --      Pain Edu? --      Excl. in [place]? --     Most recent vital signs: Vitals:   06/23/22 1521  BP: 111/64  Pulse: 85  Resp: 17  Temp: 98.7 F (37.1 C)  SpO2: 100%    Physical Exam Vitals and nursing note reviewed.  Constitutional:      General: Awake and alert. No acute distress.    Appearance: Normal appearance. The patient is normal weight.  HENT:     Head: Normocephalic and atraumatic.     Mouth: Mucous membranes are moist.  Eyes:     General: PERRL. Normal EOMs        Right eye: No discharge.        Left eye: No discharge.     Conjunctiva/sclera: Conjunctivae normal.  Cardiovascular:     Rate and Rhythm: Normal rate  and regular rhythm.     Pulses: Normal pulses.     Heart sounds: Normal heart sounds Pulmonary:     Effort: Pulmonary effort is normal. No respiratory distress.     Breath sounds: Normal breath sounds.  Abdominal:     Abdomen is soft. There is no abdominal tenderness. No rebound or guarding. No distention. Musculoskeletal:        General: No swelling. Normal range of motion.     Cervical back: Normal range of motion and neck supple.  Left knee: No deformity or rash.  Medial joint line tenderness. No patellar tenderness, no ballotment Warm and well perfused extremity with 2+ pedal pulses 5/5 strength to dorsiflexion and plantarflexion at the ankle with intact sensation throughout extremity Normal range of motion of the knee, with intact flexion and extension to active and passive range of motion.  Extensor mechanism intact. No ligamentous laxity. Negative anterior/posterior drawer/negative lachman, positive discomfort with mcmurrays No effusion or warmth Intact quadriceps, hamstring function, patellar tendon function Pelvis stable Full ROM of ankle without pain or swelling Foot warm and well perfused Skin:    General: Skin is warm and dry.     Capillary Refill: Capillary refill takes less than 2 seconds.     Findings: No rash.  Neurological:     Mental Status: The patient is awake and alert.      ED Results / Procedures / Treatments   Labs (all labs ordered are listed, but only abnormal results are displayed) Labs Reviewed - No data to display   EKG     RADIOLOGY I independently reviewed and interpreted imaging and agree with radiologists findings.     PROCEDURES:  Critical Care performed:   Procedures   MEDICATIONS ORDERED IN ED: Medications - No data to display   IMPRESSION / MDM / ASSESSMENT AND PLAN / ED COURSE  I reviewed the triage vital signs and the nursing notes.   Differential diagnosis includes, but is not limited to, meniscus injury, effusion,  ligamental injury, less likely fracture.  Patient is awake and alert, hemodynamically stable and afebrile.  She has medial joint line tenderness.  She has tenderness to the medial aspect of her knee and positive McMurray's maneuver, I suspect meniscal injury is most likely etiology.  No evidence of neurological deficit or vascular compromise on exam. No fracture/dislocation on X-Ray. No deformity or obvious ligamentous laxity on exam.No constitutional symptoms or effusion to suggest septic joint. No history of immunosuppression. Overall well appearing, vital signs stable. No indication for diagnostic or therapeutic procedure such as arthrocentesis. Return precautions and care instructions discussed. Outpatient follow-up advised for suspected meniscus injury.  She was given a knee brace for extra support.  She was given the appropriate follow-up information.  She understands return precautions.  She was discharged in stable condition.  Patient agrees with plan of care.   Patient's presentation is most consistent with acute complicated illness / injury requiring diagnostic workup.   FINAL CLINICAL IMPRESSION(S) / ED DIAGNOSES   Final diagnoses:  Acute pain of left knee     Rx / DC Orders   ED Discharge Orders     None        Note:  This document was prepared using Dragon voice recognition software and may include unintentional dictation errors.   Keturah Shavers 06/23/22 1833    Chesley Noon, MD 06/28/22 [phone]

## 2022-06-23 NOTE — ED Notes (Addendum)
Injured knee prior to Sunday--? Playing with children.  Says woke up Sunday and cannot move left knee joint.  No swelling seen, but ptaitn says she can see in the area below knee cap.  Says pain is on inner knee below the knee cap.

## 2022-06-23 NOTE — ED Notes (Signed)
Pt A&OX4 ambulatory at d/c with independent steady gait. Pt verbalized understanding of d/c instructions and follow up care. 

## 2022-06-26 ENCOUNTER — Other Ambulatory Visit: Payer: Self-pay | Admitting: Student

## 2022-06-26 DIAGNOSIS — S83242A Other tear of medial meniscus, current injury, left knee, initial encounter: Secondary | ICD-10-CM

## 2022-07-01 ENCOUNTER — Encounter: Payer: Self-pay | Admitting: Student

## 2022-07-05 ENCOUNTER — Ambulatory Visit
Admission: RE | Admit: 2022-07-05 | Discharge: 2022-07-05 | Disposition: A | Discharge status: Home / Self Care | Payer: Medicaid Other | Source: Ambulatory Visit | Attending: Student | Admitting: Student

## 2022-07-05 DIAGNOSIS — S83242A Other tear of medial meniscus, current injury, left knee, initial encounter: Secondary | ICD-10-CM

## 2022-07-09 ENCOUNTER — Ambulatory Visit (LOCAL_COMMUNITY_HEALTH_CENTER): Payer: Medicaid Other

## 2022-07-09 VITALS — BP 115/69 | Ht 69.0 in | Wt 179.0 lb

## 2022-07-09 DIAGNOSIS — Z309 Encounter for contraceptive management, unspecified: Secondary | ICD-10-CM

## 2022-07-09 DIAGNOSIS — Z3042 Encounter for surveillance of injectable contraceptive: Secondary | ICD-10-CM

## 2022-07-09 DIAGNOSIS — Z3009 Encounter for other general counseling and advice on contraception: Secondary | ICD-10-CM

## 2022-07-09 NOTE — Progress Notes (Signed)
11 weeks 0 days post depo. Voices no concerns. Depo given today per order by K. House, FNP dated 01/07/2022. Tolerated well in R deltoid. Next depo due 09/24/2022.   Abagail Kitchens, RN

## 2022-08-21 NOTE — Telephone Encounter (Signed)
No Additional Notes

## 2022-10-07 ENCOUNTER — Ambulatory Visit: Payer: Medicaid Other

## 2022-10-07 VITALS — BP 124/79 | Ht 69.0 in | Wt 175.0 lb

## 2022-10-07 DIAGNOSIS — Z3009 Encounter for other general counseling and advice on contraception: Secondary | ICD-10-CM | POA: Diagnosis not present

## 2022-10-07 DIAGNOSIS — Z3042 Encounter for surveillance of injectable contraceptive: Secondary | ICD-10-CM

## 2022-10-07 NOTE — Progress Notes (Signed)
12 weeks 6 days post depo.  States bled for a month dark blood and only light amount with occasional day of normal flow.  Stopped bleeding 10/03/22.  Desires to continue with depo.  Depo given IM left deltoid per order by Colvin Caroli FNP dated 01/07/22; tolerated well.  Next depo due 12/23/22; appt reminder given.  Cherlynn Polo, RN

## 2022-12-25 ENCOUNTER — Ambulatory Visit: Payer: Medicaid Other

## 2023-01-12 ENCOUNTER — Ambulatory Visit: Payer: Medicaid Other

## 2023-01-12 VITALS — BP 127/65 | Ht 69.0 in | Wt 180.0 lb

## 2023-01-12 DIAGNOSIS — Z309 Encounter for contraceptive management, unspecified: Secondary | ICD-10-CM

## 2023-01-12 DIAGNOSIS — Z3009 Encounter for other general counseling and advice on contraception: Secondary | ICD-10-CM

## 2023-01-12 DIAGNOSIS — Z3042 Encounter for surveillance of injectable contraceptive: Secondary | ICD-10-CM

## 2023-01-12 DIAGNOSIS — Z30013 Encounter for initial prescription of injectable contraceptive: Secondary | ICD-10-CM

## 2023-01-12 NOTE — Progress Notes (Signed)
13 Weeks   6 Days since last Depo   Voices no concerns today.  Counseled to adhere to 11 to 13 week intervals between depo injections for optimal benefit.  Depo given today per order by Aron Baba, FNP  dated 01/07/2022.  Tolerated well R De;t.  Next depo due 03/30/2023 and next annual PE due 04/24/2023,  has reminder card.  Jerel Shepherd, RN

## 2023-04-01 ENCOUNTER — Ambulatory Visit (LOCAL_COMMUNITY_HEALTH_CENTER)

## 2023-04-01 VITALS — BP 117/84 | Ht 69.0 in | Wt 188.0 lb

## 2023-04-01 DIAGNOSIS — Z30013 Encounter for initial prescription of injectable contraceptive: Secondary | ICD-10-CM

## 2023-04-01 DIAGNOSIS — Z3042 Encounter for surveillance of injectable contraceptive: Secondary | ICD-10-CM

## 2023-04-01 DIAGNOSIS — Z309 Encounter for contraceptive management, unspecified: Secondary | ICD-10-CM | POA: Diagnosis not present

## 2023-04-01 DIAGNOSIS — Z3009 Encounter for other general counseling and advice on contraception: Secondary | ICD-10-CM

## 2023-04-01 MED ORDER — MEDROXYPROGESTERONE ACETATE 150 MG/ML IM SUSP
150.0000 mg | Freq: Once | INTRAMUSCULAR | Status: AC
Start: 2023-04-01 — End: 2023-04-01
  Administered 2023-04-01: 150 mg via INTRAMUSCULAR

## 2023-04-01 NOTE — Progress Notes (Signed)
 11 Weeks   2 Days since last Depo    Voices no concerns today.  Counseled to adhere to 11 to 13 week intervals between depo injections for optimal benefit.  Depo given today per SO Dr Lorrin Mais.  Tolerated well Left deltoid  Due for annual PE 04/24/2023.  Patient plans to have annual PE when Next depo is due which is 06/17/2023,  has reminder card. Jerel Shepherd, RN

## 2023-06-17 ENCOUNTER — Ambulatory Visit

## 2023-06-30 ENCOUNTER — Encounter: Payer: Self-pay | Admitting: Family Medicine

## 2023-06-30 ENCOUNTER — Ambulatory Visit: Admitting: Family Medicine

## 2023-06-30 VITALS — BP 123/75 | HR 90 | Ht 69.0 in | Wt 186.2 lb

## 2023-06-30 DIAGNOSIS — Z309 Encounter for contraceptive management, unspecified: Secondary | ICD-10-CM | POA: Diagnosis not present

## 2023-06-30 DIAGNOSIS — Z113 Encounter for screening for infections with a predominantly sexual mode of transmission: Secondary | ICD-10-CM | POA: Insufficient documentation

## 2023-06-30 DIAGNOSIS — Z30013 Encounter for initial prescription of injectable contraceptive: Secondary | ICD-10-CM | POA: Diagnosis not present

## 2023-06-30 DIAGNOSIS — Z3042 Encounter for surveillance of injectable contraceptive: Secondary | ICD-10-CM

## 2023-06-30 LAB — HM HIV SCREENING LAB: HM HIV Screening: NEGATIVE

## 2023-06-30 MED ORDER — MEDROXYPROGESTERONE ACETATE 150 MG/ML IM SUSP
150.0000 mg | INTRAMUSCULAR | Status: AC
Start: 2023-06-30 — End: 2024-09-22
  Administered 2023-06-30 – 2023-12-09 (×3): 150 mg via INTRAMUSCULAR

## 2023-06-30 NOTE — Patient Instructions (Signed)
 STI screening - Today we obtained a vaginal swab to screen for gonorrhea, chlamydia, and trichomonas, oral swab to screen for gonorrhea, and anal swab to screen for gonorrhea - We also obtained a blood sample to screen for HIV and syphilis - If the results are abnormal, I will give you a call.    Estimated time frame for results collected at the [facility] Department: Same day Trichomonas Yeast BV (bacterial vaginosis)  Within 1-2 weeks Gonorrhea Chlamydia  Within 2-3 weeks HIV Syphilis Hepatitis B Hepatitis C    Depo Provera  Today we gave you the injectable birth control called Depo Provera .  Please come back in 11-12 weeks for your next injection.

## 2023-06-30 NOTE — Progress Notes (Signed)
 Pt here for annual physical and Depo Provera  injection.  Wet mount results reviewed with patient.  No treatment needed at this time.  Depo Provera  150mg  IM given in right deltoid without complications.  Reminder card given for next injection.  No questions or concerns verbalized.-Edwin Baines, RN

## 2023-06-30 NOTE — Progress Notes (Signed)
 [facility] [address] [place] [address] Main phone: [phone]  Family Planning Visit - Repeat Yearly Visit  Subjective:  Linda Hester is a 33 y.o. [id number]  being seen today for an annual wellness visit and to discuss contraception options. The patient is currently using hormonal injection for pregnancy prevention. Patient does not want a pregnancy in the next year.   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 She reports no complaints with depo provera . Has been getting more frequent headaches, but is unsure if this is related.   Has been on Depo Provra continuously for about 5 years (since delivery of her third child). Prior to that pregnancy, was on Depo Provera  continuously for about 4 years.   Her current family size of 3 children is her desired family size. She does not want any future pregnancies.   Review of Systems  Constitutional:  Negative for fever, malaise/fatigue and weight loss.  Respiratory:  Negative for shortness of breath.   Cardiovascular:  Negative for chest pain and palpitations.   See flowsheet for further details and programmatic requirements Hyperlink available at the top of the signed note in blue.  Flow sheet content below:   Pregnancy Intention Screening Does the patient want to become pregnant in the next year?: No Does the patient's partner want to become pregnant in the next year?: No Would the patient like to discuss contraceptive options today?: Yes Results Follow up Password: blue Sexual History What age did you start your period?: 12 How often do you have your period?: no periods with depo Date of last sex?: 06/20/23 Has the patient had unprotected sex within the last 5 days?: No Do you have sex with men, women, both men and women?: Men only In the past 2 months how many partners have you had sex with?: 1 In the past 12 months, how many partners have you had sex with?: 1 Is it possible that any of your sex partners in the past 12 months had sex with someone else whild they were still in a sexual relationship with you?: No What ways do you have sex?: Vaginal, Oral, Anal Do you or your partner use condoms and/or dental dams every time you have vaginal, oral or anal sex?: No Do you douche?: No Date of last HIV test?: 09/06/20 Have you ever had an STD?: Yes Have any of your partners had an STD?: Yes Partner Previous STD?: HSV Date?:  (6 years ago) Have you or your partner ever shot up drugs?: No Have any of your partners used drugs in the past?: No Have you or your partners exchanged money or drugs for sex?: No Risk Factors for Hep B Household, sexual, or needle sharing contact of a person infected with Hep B: No Sexual contact  with a person who uses drugs not as prescribed?: No Currently or Ever used drugs not as prescribed: No HIV Positive: No PRep Patient: No Men who have sex with men: N/A Have Hepatitis C: No History of Incarceration: No History of Homeslessness?: No Anal sex following anal drug use?: No Risk Factors for Hep C Currently using drugs not as prescribed: No Sexual partner(s) currently using drugs as not prescribed: No History of drug use: No HIV Positive: No People with a  history of incarceration: No People born between the years of 1945 and 22: No Contraception Wrap Up Current Method: Hormonal Injection End Method: Hormonal Injection Contraception Counseling Provided: Yes How was the end contraceptive method provided?: Provided on site  Diabetes screening This patient is 33 y.o. with a BMI of Body mass index is 27.5 kg/m.Aaron Aas  Is patient eligible for diabetes screening (age >35 and BMI >25)?  no  Was Hgb A1c ordered? not applicable  STI screening Patient reports 1 of partners in last year.  Does this patient desire STI screening?  Yes  Hepatitis C screening Has patient been screened once for HCV in the past?  Yes  No results found for: "HCVAB"  Does the patient meet criteria for HCV testing? No   Hepatitis B screening Does the patient meet criteria for HBV testing? No  Cervical Cancer Screening  Result Date Procedure Results Follow-ups  09/06/2019 IGP, Aptima HPV DIAGNOSIS:: Comment Specimen adequacy:: Comment Clinician Provided ICD10: Comment Performed by:: Comment PAP Smear Comment: . Note:: Comment Test Methodology: Comment HPV Aptima: Negative   03/31/2015 HM PAP SMEAR HM Pap smear: Negative    Health Maintenance Due  Topic Date Due   Pneumococcal Vaccine 64-49 Years old (1 of 2 - PCV) Never done   COVID-19 Vaccine (1 - 2024-25 season) Never done   The following portions of the patient's history were reviewed and updated as appropriate: allergies, current medications, past family history, past medical history, past social history, past surgical history and problem list. Problem list updated.  Objective:   Vitals:   06/30/23 1439  BP: 123/75  Pulse: 90  Weight: 186 lb 3.2 oz (84.5 kg)  Height: 5\' 9"  (1.753 m)   Physical Exam Vitals and nursing note reviewed.  Constitutional:      General: She is not in acute distress.    Appearance: Normal appearance. She is normal weight. She is not toxic-appearing.  HENT:     Head:  Normocephalic.     Mouth/Throat:     Mouth: Mucous membranes are moist.  Eyes:     Conjunctiva/sclera: Conjunctivae normal.  Cardiovascular:     Rate and Rhythm: Normal rate and regular rhythm.     Heart sounds: Normal heart sounds.  Pulmonary:     Effort: Pulmonary effort is normal.     Breath sounds: Normal breath sounds.  Abdominal:     Palpations: Abdomen is soft.  Genitourinary:    Comments: Declined genital exam- no symptoms, self swabbed Musculoskeletal:        General: Normal range of motion.     Cervical back: Neck supple. No rigidity or tenderness.  Lymphadenopathy:     Head:     Right side of head: No submandibular, preauricular or posterior auricular adenopathy.     Left side of head: No submandibular, preauricular or posterior auricular adenopathy.     Cervical: No cervical adenopathy.     Right cervical: No superficial or posterior cervical adenopathy.    Left cervical: No superficial  or posterior cervical adenopathy.     Upper Body:     Right upper body: No supraclavicular adenopathy.     Left upper body: No supraclavicular adenopathy.  Skin:    General: Skin is warm and dry.     Capillary Refill: Capillary refill takes less than 2 seconds.     Coloration: Skin is not jaundiced or pale.     Findings: No bruising, erythema, lesion or rash.  Neurological:     General: No focal deficit present.     Mental Status: She is alert and oriented to person, place, and time.  Psychiatric:        Mood and Affect: Mood normal.        Behavior: Behavior normal.   Assessment and Plan:  Linda Hester is a 33 y.o. female [phone] presenting to the [facility] Department for an yearly wellness and contraception visit  Contraception counseling:  Reviewed options based on patient desire and reproductive life plan. Patient is interested in Hormonal Injection. This was provided to the patient today.   Risks, benefits, and typical effectiveness rates were reviewed.   Questions were answered.  Written information was also given to the patient to review.    The patient will follow up in  3 months for surveillance.  The patient was told to call with any further questions, or with any concerns about this method of contraception.  Emphasized use of condoms 100% of the time for STI prevention.  Emergency Contraception Precautions (ECP): Patient assessed for need of ECP. She is not a candidate based on depo provera  within recommended dates.   Screening examination for venereal disease -     WET PREP FOR TRICH, YEAST, CLUE -     Syphilis Serology, [place] [facility] -     HIV [place] [facility] -     Chlamydia/Gonorrhea  Lab -     Gonococcus culture -     Gonococcus culture  Encounter for surveillance of injectable contraceptive Assessment & Plan: Extensively discussed current recommendations against long term uninterrupted use of Depo Provera . Recommended switching to another form of contraception. Given she has reached her desired family size of 3 children, discussed tubal ligation and vasectomy. At this time, Ms. Hisaw would like to think about it but proceed with Depo Provera  today.   Orders: -     medroxyPROGESTERone  Acetate  No follow-ups on file.  No future appointments.  Jack Marts, MD

## 2023-06-30 NOTE — Assessment & Plan Note (Signed)
 Extensively discussed current recommendations against long term uninterrupted use of Depo Provera . Recommended switching to another form of contraception. Given she has reached her desired family size of 3 children, discussed tubal ligation and vasectomy. At this time, Linda Hester would like to think about it but proceed with Depo Provera  today.

## 2023-07-01 LAB — WET PREP FOR TRICH, YEAST, CLUE
Clue Cell Exam: NEGATIVE
Trichomonas Exam: NEGATIVE
Yeast Exam: NEGATIVE

## 2023-07-05 LAB — GONOCOCCUS CULTURE

## 2023-07-05 LAB — SPECIMEN STATUS REPORT

## 2023-07-06 ENCOUNTER — Ambulatory Visit: Payer: Self-pay | Admitting: Family Medicine

## 2023-07-06 NOTE — Progress Notes (Signed)
 Negative STI screens: wet prep, oral gonorrhea screen, anal gonorrhea screen.   Tempie Fee, MD 07/06/23  5:33 PM

## 2023-08-27 ENCOUNTER — Encounter: Payer: Self-pay | Admitting: Family Medicine

## 2023-08-29 ENCOUNTER — Emergency Department

## 2023-08-29 ENCOUNTER — Emergency Department
Admission: EM | Admit: 2023-08-29 | Discharge: 2023-08-29 | Disposition: A | Discharge status: Home / Self Care | Attending: Emergency Medicine | Admitting: Emergency Medicine

## 2023-08-29 ENCOUNTER — Other Ambulatory Visit: Payer: Self-pay

## 2023-08-29 DIAGNOSIS — N939 Abnormal uterine and vaginal bleeding, unspecified: Secondary | ICD-10-CM | POA: Diagnosis not present

## 2023-08-29 DIAGNOSIS — R1032 Left lower quadrant pain: Secondary | ICD-10-CM | POA: Diagnosis present

## 2023-08-29 LAB — URINALYSIS, W/ REFLEX TO CULTURE (INFECTION SUSPECTED)
Bacteria, UA: NONE SEEN
Bilirubin Urine: NEGATIVE
Glucose, UA: NEGATIVE mg/dL
Hgb urine dipstick: NEGATIVE
Ketones, ur: NEGATIVE mg/dL
Leukocytes,Ua: NEGATIVE
Nitrite: NEGATIVE
Protein, ur: NEGATIVE mg/dL
Specific Gravity, Urine: 1.026 (ref 1.005–1.030)
pH: 5 (ref 5.0–8.0)

## 2023-08-29 LAB — COMPREHENSIVE METABOLIC PANEL WITH GFR
ALT: 13 U/L (ref 0–44)
AST: 20 U/L (ref 15–41)
Albumin: 4 g/dL (ref 3.5–5.0)
Alkaline Phosphatase: 63 U/L (ref 38–126)
Anion gap: 6 (ref 5–15)
BUN: 9 mg/dL (ref 6–20)
CO2: 25 mmol/L (ref 22–32)
Calcium: 9 mg/dL (ref 8.9–10.3)
Chloride: 107 mmol/L (ref 98–111)
Creatinine, Ser: 0.74 mg/dL (ref 0.44–1.00)
GFR, Estimated: >60 mL/min (ref >60)
Glucose, Bld: 103 mg/dL — ABNORMAL HIGH (ref 70–99)
Potassium: 3.7 mmol/L (ref 3.5–5.1)
Sodium: 138 mmol/L (ref 135–145)
Total Bilirubin: 0.9 mg/dL (ref 0.0–1.2)
Total Protein: 7.6 g/dL (ref 6.5–8.1)

## 2023-08-29 LAB — CBC
HCT: 41.8 % (ref 36.0–46.0)
Hemoglobin: 13.2 g/dL (ref 12.0–15.0)
MCH: 28.8 pg (ref 26.0–34.0)
MCHC: 31.6 g/dL (ref 30.0–36.0)
MCV: 91.1 fL (ref 80.0–100.0)
Platelets: 234 K/uL (ref 150–400)
RBC: 4.59 MIL/uL (ref 3.87–5.11)
RDW: 13.5 % (ref 11.5–15.5)
WBC: 5.5 K/uL (ref 4.0–10.5)
nRBC: 0 % (ref 0.0–0.2)

## 2023-08-29 LAB — POC URINE PREG, ED: Preg Test, Ur: NEGATIVE

## 2023-08-29 LAB — LIPASE, BLOOD: Lipase: 28 U/L (ref 11–51)

## 2023-08-29 NOTE — ED Notes (Signed)
 Patient c/o vaginal bleeding/spotting with what she believes is small flecks of tissue in it. Patient has recently had a pelvic exam for same. Patient is on Depo and was told it is likely due to that. Was initially told it could be a ruptured cyst on the ovary but was told there were no cyst during the exam. Patient stated she is unable to lay on her stomach or left side without the pain wrapping around to her back and causing pressure in her buttock.

## 2023-08-29 NOTE — ED Notes (Signed)
 Sen green top to lab and urine

## 2023-08-29 NOTE — Discharge Instructions (Signed)
 You are seen in the emergency department today for vaginal bleeding and abdominal pain.  Your testing fortunately did not show an emergency cause for this.  Please follow-up with OB/GYN for further evaluation.  Return to the ER for new or worsening symptoms.

## 2023-08-29 NOTE — ED Triage Notes (Signed)
 Pt presents to ER with complaints of LLQ pain and vaginal spotting for a week. Pt states the abdominal pain has been going on for a while and her PCP informed her it was a cyst and that it will pass, pt reports for the past week the pain has been constant and started spotting. Pt uses DEPO shot and does not get her menstrual cycles. Pt denies any nausea, vomiting, diarrhea. Pt denies any other symptom. Pt talks in complete sentences no respiratory distress noted

## 2023-08-29 NOTE — ED Provider Notes (Signed)
 [facility] Provider Note    Event Date/Time   First MD Initiated Contact with Patient 08/29/23 1126     (approximate)   History   Vaginal Bleeding and Abdominal Pain   HPI  Linda Hester is a 33 year old female presenting to the ER for evaluation of intermittent lower abdominal pain and spotting.  Patient reports that over the past few weeks she has had intermittent episodes of pain in her left lower quadrant and suprapubic region and vaginal spotting.  Is on a Depo injection does not typically have menstrual bleeding.  She has been seen by her primary care doctor where they thought that she may have had an ovarian cyst, but with recurrent pain presents to the ER.  No nausea or vomiting.  No upper abdominal pain.  Reports recent STD testing, denies concerns for STIs currently.  Denies dysuria, urinary frequency.     Physical Exam   Triage Vital Signs: ED Triage Vitals [08/29/23 1114]  Encounter Vitals Group     BP (!) 124/91     Girls Systolic BP Percentile      Girls Diastolic BP Percentile      Boys Systolic BP Percentile      Boys Diastolic BP Percentile      Pulse Rate 80     Resp 16     Temp 97.7 F (36.5 C)     Temp Source Oral     SpO2 100 %     Weight 180 lb (81.6 kg)     Height 5' 9 (1.753 m)     Head Circumference      Peak Flow      Pain Score 7     Pain Loc      Pain Education      Exclude from Growth Chart     Most recent vital signs: Vitals:   08/29/23 1114  BP: (!) 124/91  Pulse: 80  Resp: 16  Temp: 97.7 F (36.5 C)  SpO2: 100%     General: Awake, interactive  CV:  Regular rate, good peripheral perfusion.  Resp:  Unlabored respirations Abd:  Nondistended, soft, tenderness to the left lower quadrant without rebound or guarding, remainder of abdomen nontender Neuro:  Symmetric facial movement, fluid speech   ED Results / Procedures / Treatments   Labs (all labs ordered are listed, but only abnormal results are  displayed) Labs Reviewed  URINALYSIS, W/ REFLEX TO CULTURE (INFECTION SUSPECTED) - Abnormal; Notable for the following components:      Result Value   Color, Urine YELLOW (*)    APPearance CLEAR (*)    All other components within normal limits  COMPREHENSIVE METABOLIC PANEL WITH GFR - Abnormal; Notable for the following components:   Glucose, Bld 103 (*)    All other components within normal limits  CBC  LIPASE, BLOOD  POC URINE PREG, ED     EKG EKG independently reviewed and interpreted by myself demonstrates:    RADIOLOGY Imaging independently reviewed and interpreted by myself demonstrates:  Pelvic ultrasound without torsion  Formal Radiology Read:  US  PELVIC COMPLETE W TRANSVAGINAL AND TORSION R/O Result Date: 08/29/2023 CLINICAL DATA:  [id number] Pelvic pain [id number] EXAM: TRANSVAGINAL ULTRASOUND OF PELVIS DOPPLER ULTRASOUND OF OVARIES TECHNIQUE: Transvaginal ultrasound examination of the pelvis was performed including evaluation of the uterus, ovaries, adnexal regions, and pelvic cul-de-sac. Color and duplex Doppler ultrasound was utilized to evaluate blood flow to the ovaries. COMPARISON:  December 26, 2016, December 25, 2016  FINDINGS: Uterus Measurements: 7.8 x 4.4 x 5.3 cm = volume: 96 mL. No fibroids or other mass visualized. Endometrium Thickness: 7 mm. There is a small amount of fluid splaying the endometrial stripe. Right ovary Measurements: 2.7 x 1.7 x 2.5 cm = volume: 5.9 mL. Normal appearance/no adnexal mass. 1.2 cm dominant follicle. Left ovary Measurements: 3 x 2.5 x 2 cm = volume: 7.6 mL. Normal appearance/no adnexal mass. Pulsed Doppler evaluation demonstrates normal low-resistance arterial and venous waveforms in both ovaries. Other findings:  No abnormal free fluid IMPRESSION: Nonenlarged ovaries. No sonographic findings to suggest ovarian torsion, at this time. Otherwise, normal pelvic ultrasound. Electronically Signed   By: Rogelia Myers M.D.   On: 08/29/2023 13:44     PROCEDURES:  Critical Care performed: No  Procedures   MEDICATIONS ORDERED IN ED: Medications - No data to display   IMPRESSION / MDM / ASSESSMENT AND PLAN / ED COURSE  I reviewed the triage vital signs and the nursing notes.  Differential diagnosis includes, but is not limited to, ovarian torsion, ovarian cyst, UTI, ectopic pregnancy, irregular menstrual bleeding secondary to Depo use, much lower suspicion significant acute intra-abdominal process given overall reassuring abdominal exam  Patient's presentation is most consistent with acute presentation with potential threat to life or bodily function.  33 year old female presenting to the emergency department for evaluation of intermittent episodes of left lower quadrant abdominal pain/pelvic pain and vaginal spotting over the past several weeks.  Reassuring vitals and exam on presentation here.  Will obtain labs, pelvic ultrasound to further evaluate.  Mild tenderness on exam, in lower pelvic area, low suspicion acute intra-abdominal process, do not think abdominal imaging is indicated at this time.  Also with reassuring CBC including normal hemoglobin at 13.2.  CMP without significant derangement.  Normal lipase.  Negative UPT.  Urine without evidence of infection.  Pelvic ultrasound without evidence of torsion, ovarian cyst, other acute abnormality.  Patient reassessed and updated on results of workup.  She is comfortable with discharge and outpatient follow-up with OB/GYN.  Strict return precautions provided.  Patient discharged in stable condition.    FINAL CLINICAL IMPRESSION(S) / ED DIAGNOSES   Final diagnoses:  Vaginal bleeding  Left lower quadrant abdominal pain     Rx / DC Orders   ED Discharge Orders     None        Note:  This document was prepared using Dragon voice recognition software and may include unintentional dictation errors.   Levander Slate, MD 08/29/23 [phone]

## 2023-09-22 ENCOUNTER — Ambulatory Visit

## 2023-09-22 VITALS — BP 118/68 | Ht 69.0 in | Wt 182.0 lb

## 2023-09-22 DIAGNOSIS — Z3042 Encounter for surveillance of injectable contraceptive: Secondary | ICD-10-CM

## 2023-09-22 DIAGNOSIS — Z309 Encounter for contraceptive management, unspecified: Secondary | ICD-10-CM | POA: Diagnosis not present

## 2023-09-22 DIAGNOSIS — Z3009 Encounter for other general counseling and advice on contraception: Secondary | ICD-10-CM

## 2023-09-22 DIAGNOSIS — Z30013 Encounter for initial prescription of injectable contraceptive: Secondary | ICD-10-CM | POA: Diagnosis not present

## 2023-09-22 NOTE — Progress Notes (Signed)
 12 weeks and 0 days post depo. Voices no concerns. Depo given today per order by JAYSON Helling, MD dated 06/30/2023. Tolerated well in L deltoid. Next depo due 12/08/2023; patient aware.  Doyce CINDERELLA Shuck, RN

## 2023-12-09 ENCOUNTER — Ambulatory Visit

## 2023-12-09 VITALS — BP 116/77 | Ht 69.0 in | Wt 177.0 lb

## 2023-12-09 DIAGNOSIS — Z3009 Encounter for other general counseling and advice on contraception: Secondary | ICD-10-CM

## 2023-12-09 DIAGNOSIS — Z3042 Encounter for surveillance of injectable contraceptive: Secondary | ICD-10-CM

## 2023-12-09 NOTE — Progress Notes (Signed)
 11 Weeks   1 Days since last Depo    Voices no concerns today.   Requests last annual exam records to be sent to [facility]. ROI signed and given to clerk to process and send records.   Counseled to adhere to 11 to 13 week intervals between depo injections for optimal benefit.  Depo given today per order by Dr JAYSON Helling  dated 06/30/2023.  Tolerated well R delt.  Next depo due 02/24/2024,  has reminder card.  Linda Staffa, RN

## 2024-03-02 ENCOUNTER — Ambulatory Visit
